# Patient Record
Sex: Female | Born: 1990 | Race: Black or African American | Hispanic: No | Marital: Single | Smoking: Former smoker
Health system: Southern US, Community
[De-identification: ages and names within clinical notes are randomized; demographics above are authoritative.]

## PROBLEM LIST (undated history)

## (undated) DIAGNOSIS — O139 Gestational [pregnancy-induced] hypertension without significant proteinuria, unspecified trimester: Secondary | ICD-10-CM

## (undated) DIAGNOSIS — T7840XA Allergy, unspecified, initial encounter: Secondary | ICD-10-CM

## (undated) HISTORY — DX: Gestational (pregnancy-induced) hypertension without significant proteinuria, unspecified trimester: O13.9

## (undated) HISTORY — DX: Allergy, unspecified, initial encounter: T78.40XA

---
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
## 2012-01-29 ENCOUNTER — Emergency Department (HOSPITAL_COMMUNITY)
Admission: EM | Admit: 2012-01-29 | Discharge: 2012-01-29 | Disposition: A | Discharge status: Home / Self Care | Payer: Managed Care, Other (non HMO) | Attending: Emergency Medicine | Admitting: Emergency Medicine

## 2012-01-29 ENCOUNTER — Encounter (HOSPITAL_COMMUNITY): Payer: Self-pay | Admitting: Emergency Medicine

## 2012-01-29 DIAGNOSIS — N390 Urinary tract infection, site not specified: Secondary | ICD-10-CM | POA: Insufficient documentation

## 2012-01-29 DIAGNOSIS — F17200 Nicotine dependence, unspecified, uncomplicated: Secondary | ICD-10-CM | POA: Insufficient documentation

## 2012-01-29 LAB — URINALYSIS, ROUTINE W REFLEX MICROSCOPIC
Glucose, UA: NEGATIVE mg/dL
Ketones, ur: 15 mg/dL — AB
Nitrite: NEGATIVE
Protein, ur: 100 mg/dL — AB
Urobilinogen, UA: 0.2 mg/dL (ref 0.0–1.0)

## 2012-01-29 LAB — POCT PREGNANCY, URINE: Preg Test, Ur: NEGATIVE

## 2012-01-29 LAB — URINE MICROSCOPIC-ADD ON

## 2012-01-29 MED ORDER — SULFAMETHOXAZOLE-TRIMETHOPRIM 800-160 MG PO TABS: 1.0000 | ORAL_TABLET | Freq: Two times a day (BID) | ORAL | Status: AC

## 2012-01-29 MED ORDER — ONDANSETRON 4 MG PO TBDP
8.0000 mg | ORAL_TABLET | Freq: Once | ORAL | Status: AC
  Administered 2012-01-29: 8 mg via ORAL
  Filled 2012-01-29: qty 2

## 2012-01-29 MED ORDER — SULFAMETHOXAZOLE-TMP DS 800-160 MG PO TABS
1.0000 | ORAL_TABLET | Freq: Once | ORAL | Status: AC
  Administered 2012-01-29: 1 via ORAL
  Filled 2012-01-29: qty 1

## 2012-01-29 NOTE — ED Notes (Signed)
Patient with right flank pain, sharp in nature, started 4 days ago.  Patient does have some nausea.  No vomiting.

## 2012-01-29 NOTE — ED Notes (Signed)
Pt reports burning when she urinates and noticing more frequency in having to use restroom.

## 2012-01-29 NOTE — ED Provider Notes (Signed)
History     CSN: [id number]  Arrival date & time 01/29/12  1846   First MD Initiated Contact with Patient 01/29/12 2203      Chief Complaint  Patient presents with  . Flank Pain    (Consider location/radiation/quality/duration/timing/severity/associated sxs/prior treatment) Patient is a 21 y.o. female presenting with flank pain. The history is provided by the patient.  Flank Pain This is a new problem. Episode onset: 4 days ago. The problem occurs constantly. The problem has been gradually worsening. Pertinent negatives include no abdominal pain and no shortness of breath. Associated symptoms comments: Nausea, no vomiting or fever. The symptoms are aggravated by nothing. The symptoms are relieved by nothing. She has tried nothing for the symptoms. The treatment provided no relief.    History reviewed. No pertinent past medical history.  History reviewed. No pertinent past surgical history.  History reviewed. No pertinent family history.  History  Substance Use Topics  . Smoking status: Current Everyday Smoker    Types: Cigarettes  . Smokeless tobacco: Not on file  . Alcohol Use: No    OB History    Grav Para Term Preterm Abortions TAB SAB Ect Mult Living                  Review of Systems  Constitutional: Negative for fever and chills.  Respiratory: Negative for shortness of breath.   Gastrointestinal: Negative for abdominal pain.  Genitourinary: Positive for dysuria, urgency, frequency and flank pain. Negative for vaginal bleeding and vaginal discharge.  All other systems reviewed and are negative.    Allergies  Review of patient's allergies indicates no known allergies.  Home Medications   Current Outpatient Rx  Name Route Sig Dispense Refill  . JUNEL FE 1/20 1-20 MG-MCG PO TABS  TAKE 1 TABLET BY MOUTH DAILY 1 each 0    Needs ov    BP 130/79  Pulse 91  Temp(Src) 98.3 F (36.8 C) (Oral)  Resp 18  SpO2 100%  Physical Exam  Nursing note and vitals  reviewed. Constitutional: She is oriented to person, place, and time. She appears well-developed and well-nourished. No distress.  HENT:  Head: Normocephalic and atraumatic.  Mouth/Throat: Oropharynx is clear and moist.  Eyes: Conjunctivae and EOM are normal. Pupils are equal, round, and reactive to light.  Neck: Normal range of motion. Neck supple.  Cardiovascular: Normal rate, regular rhythm and intact distal pulses.   No murmur heard. Pulmonary/Chest: Effort normal and breath sounds normal. No respiratory distress. She has no wheezes. She has no rales.  Abdominal: Soft. She exhibits no distension. There is no tenderness. There is CVA tenderness. There is no rebound and no guarding.       Right cva tenderness  Musculoskeletal: Normal range of motion. She exhibits no edema and no tenderness.  Neurological: She is alert and oriented to person, place, and time.  Skin: Skin is warm and dry. No rash noted. No erythema.  Psychiatric: She has a normal mood and affect. Her behavior is normal.    ED Course  Procedures (including critical care time)  Labs Reviewed  URINALYSIS, ROUTINE W REFLEX MICROSCOPIC - Abnormal; Notable for the following:    APPearance CLOUDY (*)    Hgb urine dipstick LARGE (*)    Ketones, ur 15 (*)    Protein, ur 100 (*)    Leukocytes, UA LARGE (*)    All other components within normal limits  URINE MICROSCOPIC-ADD ON - Abnormal; Notable for the following:  Squamous Epithelial / LPF MANY (*)    Bacteria, UA MANY (*)    All other components within normal limits  POCT PREGNANCY, URINE   No results found.   1. UTI (lower urinary tract infection)       MDM   Pt without sx of pyelo ( no vomiting or fever)  Denies vag complaints.  UPT neg.  UA with UTI.  Pt treated with bactrim        Gwyneth Sprout, MD 01/30/12 [phone]

## 2012-01-29 NOTE — Discharge Instructions (Signed)

## 2012-01-30 MED ORDER — FLUCONAZOLE 200 MG PO TABS: 200.0000 mg | ORAL_TABLET | Freq: Every day | ORAL | Status: AC

## 2012-02-22 ENCOUNTER — Telehealth: Payer: Self-pay

## 2012-02-22 NOTE — Telephone Encounter (Signed)
Is this something she will need to come in to clinic to reevaluate or can we just write the note?

## 2012-02-22 NOTE — Telephone Encounter (Signed)
Based on documentation by Dr. Hal Hope that sounds appropriate. Ok to write note.

## 2012-02-22 NOTE — Telephone Encounter (Signed)
PT STATES IN NEED OF HER MEDICAL RECORDS, SHE IS GOING INTO THE [employer] AND THEY ARE REQUESTING THEM  ALSO PT SHE NEED A NOTE STATING THAT EVEN THOUGH WE SAW A CYST ON HER CHEST, SHE DOESN'T NEED SURGERY AND THEY NO LONGER SEE THE CYST REALLY NEED THIS NOTE BY Monday AND CAN WAIT ON THE MEDICAL RECORDS IF SHE HAVE TO PLEASE CALL [phone] WHEN READY

## 2012-02-25 NOTE — Telephone Encounter (Signed)
Letter written/printed and in drawer for p/up. Pt notified on VM.

## 2012-07-08 ENCOUNTER — Telehealth: Payer: Self-pay

## 2012-07-08 NOTE — Telephone Encounter (Signed)
Patient over due for follow up. She is advised. She wants to come in next week on Thursday, she is advised to come in to the Urgent care then.

## 2012-07-08 NOTE — Telephone Encounter (Signed)
PT WOULD LIKE A REFILL ON HER BIRTH CONTROL PILLS AND WAS TOLD NOT TO CALL THE PHARMACY FIRST, BUT TO CALL us PLEASE CALL [phone]   [facility] ON [address]

## 2012-07-17 ENCOUNTER — Ambulatory Visit (INDEPENDENT_AMBULATORY_CARE_PROVIDER_SITE_OTHER): Payer: Managed Care, Other (non HMO) | Admitting: Family Medicine

## 2012-07-17 VITALS — BP 133/79 | HR 68 | Temp 98.2°F | Resp 16 | Ht 64.0 in | Wt 155.4 lb

## 2012-07-17 DIAGNOSIS — Z3041 Encounter for surveillance of contraceptive pills: Secondary | ICD-10-CM

## 2012-07-17 DIAGNOSIS — Z01419 Encounter for gynecological examination (general) (routine) without abnormal findings: Secondary | ICD-10-CM

## 2012-07-17 DIAGNOSIS — T192XXA Foreign body in vulva and vagina, initial encounter: Secondary | ICD-10-CM

## 2012-07-17 DIAGNOSIS — Z0000 Encounter for general adult medical examination without abnormal findings: Secondary | ICD-10-CM

## 2012-07-17 DIAGNOSIS — F17200 Nicotine dependence, unspecified, uncomplicated: Secondary | ICD-10-CM

## 2012-07-17 DIAGNOSIS — N72 Inflammatory disease of cervix uteri: Secondary | ICD-10-CM

## 2012-07-17 LAB — POCT CBC
Granulocyte percent: 64.2 %G (ref 37–80)
HCT, POC: 46.5 % (ref 37.7–47.9)
Hemoglobin: 14 g/dL (ref 12.2–16.2)
MCV: 98.8 fL — AB (ref 80–97)
RBC: 4.71 M/uL (ref 4.04–5.48)

## 2012-07-17 MED ORDER — NORETHIN ACE-ETH ESTRAD-FE 1-20 MG-MCG PO TABS: 1.0000 | ORAL_TABLET | Freq: Every day | ORAL | Status: DC

## 2012-07-17 MED ORDER — CIPROFLOXACIN HCL 500 MG PO TABS: 500.0000 mg | ORAL_TABLET | Freq: Two times a day (BID) | ORAL | Status: DC

## 2012-07-17 NOTE — Progress Notes (Signed)
History: Patient is here for physical examination. She desired a refill of her birth control pills. She is generally healthy.  Past medical history: Surgeries: None Major medical illnesses: None Hospitalizations: None Medication allergies: None Regular medications: Oral contraception  Social history: Start going to college but then quit that. She is in the process of applying to join the [employer]. She's always wanted to be in the [employer]. She is single. Has a boyfriend whom she has had for a couple of months.  Review of systems: Constitutional: Unremarkable HEENT: Unremarkable Respiratory: Unremarkable GI: Unremarkable GU: Vaginal odor. She's tried douching. Musculoskeletal: Unremarkable Neurologic: Unremarkable Dermatologic: Unremarkable Psychiatric: Unremarkable  Physical examination Well-developed well-nourished young lady in no acute distress. HEENT normal. Eyes PERRLA. Fundi benign. Throat clear. Neck supple without nodes thyromegaly. No current bruits. Chest clear to auscultation. Heart regular without murmurs gallops or arrhythmias. Breasts full without any masses. She has multiple piercings including her tongue nipples and umbilicus. Abdomen soft without mass or tenderness. Pelvic normal external genitalia. Odor was noted. Vaginal exam reveals a retained tampon. This was removed with ring forceps without anesthesia. She is quite tender. Digital exam was normal. The cervix was friable. Pap was taken. Also did a routine bacterial culture to extremities unremarkable. Skin unremarkable.  Assessment: Physical examination Contraception Cervicitis Retained tampon  Plan: Refill contraception pills. Instructed her to use condoms for the first month. Advised regular condom use to avoid STDs. She does have a history of having had Chlamydia once. Denied sexual activity since she ran out of the pills.  Cipro 500 twice a day for one week Return if worse.       Results for  orders placed in visit on 07/17/12  POCT CBC      Component Value Range   WBC 7.9  4.6 - 10.2 K/uL   Lymph, poc 2.4  0.6 - 3.4   POC LYMPH PERCENT 31.0  10 - 50 %L   MID (cbc) 0.4  0 - 0.9   POC MID % 4.8  0 - 12 %M   POC Granulocyte 5.1  2 - 6.9   Granulocyte percent 64.2  37 - 80 %G   RBC 4.71  4.04 - 5.48 M/uL   Hemoglobin 14.0  12.2 - 16.2 g/dL   HCT, POC 16.1  09.6 - 47.9 %   MCV 98.8 (*) 80 - 97 fL   MCH, POC 29.7  27 - 31.2 pg   MCHC 30.1 (*) 31.8 - 35.4 g/dL   RDW, POC 04.5     Platelet Count, POC 272  142 - 424 K/uL   MPV 9.3  0 - 99.8 fL

## 2012-07-17 NOTE — Patient Instructions (Signed)
Take antibiotics for infected cervix.  Return if worse vaginal symptoms.  Take pills faithfully, but use condoms also.

## 2012-07-18 LAB — LIPID PANEL
Cholesterol: 151 mg/dL (ref 0–200)
LDL Cholesterol: 64 mg/dL (ref 0–99)
VLDL: 21 mg/dL (ref 0–40)

## 2012-07-18 LAB — COMPREHENSIVE METABOLIC PANEL
ALT: 14 U/L (ref 0–35)
AST: 14 U/L (ref 0–37)
Albumin: 4.8 g/dL (ref 3.5–5.2)
BUN: 12 mg/dL (ref 6–23)
CO2: 27 mEq/L (ref 19–32)
Calcium: 9.8 mg/dL (ref 8.4–10.5)
Chloride: 103 mEq/L (ref 96–112)
Potassium: 3.8 mEq/L (ref 3.5–5.3)

## 2012-07-19 LAB — RPR

## 2012-07-19 LAB — HIV ANTIBODY (ROUTINE TESTING W REFLEX): HIV: NONREACTIVE

## 2012-07-21 LAB — WOUND CULTURE
Gram Stain: NONE SEEN
Organism ID, Bacteria: NORMAL

## 2012-07-22 LAB — PAP IG, CT-NG, RFX HPV ASCU: Chlamydia Probe Amp: POSITIVE — AB

## 2012-07-24 ENCOUNTER — Other Ambulatory Visit: Payer: Self-pay

## 2012-07-24 MED ORDER — DOXYCYCLINE HYCLATE 100 MG PO CAPS: 100.0000 mg | ORAL_CAPSULE | Freq: Two times a day (BID) | ORAL | Status: DC

## 2012-07-24 MED ORDER — FLUCONAZOLE 150 MG PO TABS: 150.0000 mg | ORAL_TABLET | Freq: Once | ORAL | Status: DC

## 2012-08-18 ENCOUNTER — Telehealth: Payer: Self-pay

## 2012-08-18 DIAGNOSIS — Z3041 Encounter for surveillance of contraceptive pills: Secondary | ICD-10-CM

## 2012-08-18 MED ORDER — NORETHIN ACE-ETH ESTRAD-FE 1-20 MG-MCG PO TABS: 1.0000 | ORAL_TABLET | Freq: Every day | ORAL | Status: DC

## 2012-08-18 NOTE — Telephone Encounter (Signed)
Resubmitted so she can get 36mo with refills/ amy

## 2012-08-18 NOTE — Telephone Encounter (Signed)
Pt states her insurance company will not pay for a thirty day supply for her birth control pills.  She needs 90 days   [facility] [address]   [phone]

## 2012-09-15 ENCOUNTER — Telehealth: Payer: Self-pay

## 2012-09-15 NOTE — Telephone Encounter (Signed)
Pt says she had to call in to get her 90 day supply of her birth control pills [phone]

## 2012-09-15 NOTE — Telephone Encounter (Signed)
This was sent in for her, 3 packs with 4 refills last month. She is advised, if pharmacy needs anything else to call me.

## 2012-09-16 ENCOUNTER — Other Ambulatory Visit: Payer: Self-pay | Admitting: Radiology

## 2012-09-16 DIAGNOSIS — Z3041 Encounter for surveillance of contraceptive pills: Secondary | ICD-10-CM

## 2012-09-16 MED ORDER — NORETHIN ACE-ETH ESTRAD-FE 1-20 MG-MCG PO TABS: 1.0000 | ORAL_TABLET | Freq: Every day | ORAL | Status: DC

## 2012-09-16 NOTE — Telephone Encounter (Signed)
Rx was sent in for patient to [facility] for her BCP, now the [facility] is out of business, the remaining supply is reordered through mail order pharmacy per her request.

## 2012-09-22 ENCOUNTER — Telehealth: Payer: Self-pay

## 2012-09-22 NOTE — Telephone Encounter (Signed)
Patient called wanting to speak to a female Docter or only Amy because she wanted to ask some personal questions about her "female problems". Patient wouldn't say exactly what or if it is an emergency. All she said was that she didn't want to pay an office visit if they (female clinical) can tell her what she has.

## 2012-09-23 NOTE — Telephone Encounter (Signed)
Patient c/o vaginal irritation comes and goes. States she can come in for this, and we can do a Wet prep. She wants to make appt. I have advised her this is the best way to deal with this, is to come in for wet prep and discussion.

## 2012-12-13 ENCOUNTER — Telehealth: Payer: Self-pay

## 2012-12-13 DIAGNOSIS — Z3041 Encounter for surveillance of contraceptive pills: Secondary | ICD-10-CM

## 2012-12-13 NOTE — Telephone Encounter (Signed)
Need refill on bcp. Please write for 90 day supply.  [facility] [address]  [phone]

## 2012-12-14 MED ORDER — NORETHIN ACE-ETH ESTRAD-FE 1-20 MG-MCG PO TABS: 1.0000 | ORAL_TABLET | Freq: Every day | ORAL | Status: DC

## 2012-12-14 NOTE — Telephone Encounter (Signed)
Spoke with pt. Advised Rx sent to her pharmacy.

## 2012-12-14 NOTE — Telephone Encounter (Signed)
done

## 2013-03-17 ENCOUNTER — Telehealth: Payer: Self-pay

## 2013-03-17 NOTE — Telephone Encounter (Signed)
Error

## 2013-10-08 ENCOUNTER — Ambulatory Visit (INDEPENDENT_AMBULATORY_CARE_PROVIDER_SITE_OTHER): Payer: Managed Care, Other (non HMO) | Admitting: Family Medicine

## 2013-10-08 VITALS — BP 122/70 | HR 89 | Temp 98.7°F | Resp 16 | Ht 63.75 in | Wt 172.0 lb

## 2013-10-08 DIAGNOSIS — N898 Other specified noninflammatory disorders of vagina: Secondary | ICD-10-CM

## 2013-10-08 DIAGNOSIS — L0291 Cutaneous abscess, unspecified: Secondary | ICD-10-CM

## 2013-10-08 DIAGNOSIS — Z23 Encounter for immunization: Secondary | ICD-10-CM

## 2013-10-08 DIAGNOSIS — Z3041 Encounter for surveillance of contraceptive pills: Secondary | ICD-10-CM

## 2013-10-08 DIAGNOSIS — L0390 Cellulitis, unspecified: Secondary | ICD-10-CM

## 2013-10-08 DIAGNOSIS — Z113 Encounter for screening for infections with a predominantly sexual mode of transmission: Secondary | ICD-10-CM

## 2013-10-08 DIAGNOSIS — N912 Amenorrhea, unspecified: Secondary | ICD-10-CM

## 2013-10-08 LAB — POCT UA - MICROSCOPIC ONLY
Casts, Ur, LPF, POC: NEGATIVE
Crystals, Ur, HPF, POC: NEGATIVE
Mucus, UA: NEGATIVE
RBC, urine, microscopic: NEGATIVE
Yeast, UA: NEGATIVE

## 2013-10-08 LAB — POCT URINALYSIS DIPSTICK
Bilirubin, UA: NEGATIVE
Blood, UA: NEGATIVE
Glucose, UA: NEGATIVE
Ketones, UA: NEGATIVE
Leukocytes, UA: NEGATIVE
Nitrite, UA: NEGATIVE
Protein, UA: NEGATIVE
Spec Grav, UA: 1.02
Urobilinogen, UA: 0.2
pH, UA: 8

## 2013-10-08 LAB — POCT WET PREP WITH KOH
KOH Prep POC: NEGATIVE
Trichomonas, UA: NEGATIVE
Yeast Wet Prep HPF POC: NEGATIVE

## 2013-10-08 LAB — POCT CBC
Granulocyte percent: 39.7 % (ref 37–80)
HCT, POC: 41.7 % (ref 37.7–47.9)
Hemoglobin: 13 g/dL (ref 12.2–16.2)
Lymph, poc: 2.2 (ref 0.6–3.4)
MCH, POC: 30.4 pg (ref 27–31.2)
MCHC: 31.2 g/dL — AB (ref 31.8–35.4)
MCV: 97.6 fL — AB (ref 80–97)
MID (cbc): 0.4 (ref 0–0.9)
MPV: 8.1 fL (ref 0–99.8)
POC Granulocyte: 1.7 — AB (ref 2–6.9)
POC LYMPH PERCENT: 51.1 %L — AB (ref 10–50)
POC MID %: 9.2 %M (ref 0–12)
Platelet Count, POC: 217 10*3/uL (ref 142–424)
RBC: 4.27 M/uL (ref 4.04–5.48)
RDW, POC: 13.2 %
WBC: 4.3 10*3/uL — AB (ref 4.6–10.2)

## 2013-10-08 LAB — POCT URINE PREGNANCY: Preg Test, Ur: NEGATIVE

## 2013-10-08 LAB — POCT GLYCOSYLATED HEMOGLOBIN (HGB A1C): Hemoglobin A1C: 5.4

## 2013-10-08 MED ORDER — NORETHIN ACE-ETH ESTRAD-FE 1-20 MG-MCG PO TABS: 1.0000 | ORAL_TABLET | Freq: Every day | ORAL | Status: DC

## 2013-10-08 MED ORDER — DOXYCYCLINE HYCLATE 100 MG PO CAPS: 100.0000 mg | ORAL_CAPSULE | Freq: Two times a day (BID) | ORAL | Status: DC

## 2013-10-08 MED ORDER — FLUCONAZOLE 150 MG PO TABS: 150.0000 mg | ORAL_TABLET | Freq: Once | ORAL | Status: DC

## 2013-10-08 NOTE — Progress Notes (Signed)
Chief Complaint:  Chief Complaint  Patient presents with  . Recurrent Skin Infections    Boils vaginal and thigh  . Flu Vaccine  . Contraception    Stopped taking birth control     HPI: Danielle Baldwin is a 23 y.o. female who is here for : 1. Boils-has vaginal boils and that has gone down but she has 1 onher thigh that still is present but better. Recurrent boils regular. She is not diabetic. Last episode of boils was 2 weeks ago. She gets them occ in her armpits, she states he may have had it x 2. No fevers, chills 2. Desires flu vaccine, deneis fevers, chills, had flu vaccine in the past 3. Would like OCP  She has been on many in the past., she was on OCP and then  3 years after that she was on Depot ( she was getting weight gain) , OCP Jenelle Fe was stopped in JUne 2015. Wants to know if she has other options. She is a smoker.  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 Allergy    History reviewed. No pertinent past surgical history. History   Social History  . Marital Status: Single    Spouse Name: N/A    Number of Children: N/A  . Years of Education: N/A   Social History Main Topics  . Smoking status: Current Every Day Smoker    Types: Cigarettes  . Smokeless tobacco: None  . Alcohol Use: No  . Drug Use: No  . Sexual Activity: Yes    Birth Control/ Protection: Pill   Other Topics Concern  . None   Social History Narrative  . None   Family History  Problem Relation Age of Onset  . Hypertension Mother   . Hypertension Maternal Grandmother   . Cancer Maternal Grandfather    No Known Allergies Prior to Admission medications   Medication Sig Start Date End Date Taking? Authorizing Provider  ciprofloxacin (CIPRO) 500 MG tablet Take 1 tablet (500 mg total) by mouth 2 (two) times daily.  07/17/12   Peyton Najjar, MD  doxycycline (VIBRAMYCIN) 100 MG capsule Take 1 capsule (100 mg total) by mouth 2 (two) times daily. 07/24/12   Peyton Najjar, MD  fluconazole (DIFLUCAN) 150 MG tablet Take 1 tablet (150 mg total) by mouth once. May repeat if needed after completion of Antibiotic. 07/24/12   Peyton Najjar, MD  norethindrone-ethinyl estradiol (JUNEL FE 1/20) 1-20 MG-MCG tablet Take 1 tablet by mouth daily. 12/14/12   Anders Simmonds, PA-C     ROS: The patient denies fevers, chills, night sweats, unintentional weight loss, chest pain, palpitations, wheezing, dyspnea on exertion, nausea, vomiting, abdominal pain, dysuria, hematuria, melena, numbness, weakness, or tingling.   All other systems have been reviewed and were otherwise negative with the exception of those mentioned in the HPI and as above.    PHYSICAL EXAM: Filed Vitals:   10/08/13 1641  BP: 122/70  Pulse: 89  Temp: 98.7 F (37.1 C)  Resp: 16   Filed Vitals:   10/08/13 1641  Height: 5' 3.75" (1.619 m)  Weight: 172 lb (78.019 kg)   Body mass index is 29.77 kg/(m^2).  General: Alert, no acute distress HEENT:  Normocephalic, atraumatic, oropharynx patent.  EOMI, PERRLA Cardiovascular:  Regular rate and rhythm, no rubs murmurs or gallops.  No Carotid bruits, radial pulse intact. No pedal edema.  Respiratory: Clear to auscultation bilaterally.  No wheezes, rales, or rhonchi.  No cyanosis, no use of accessory musculature GI: No organomegaly, abdomen is soft and non-tender, positive bowel sounds.  No masses. Skin: + abscess 3/4 iinch in diameter on right inner groin, + inguinal LAD, no axillary LAD or masses Neurologic: Facial musculature symmetric. Psychiatric: Patient is appropriate throughout our interaction. Lymphatic: No cervical lymphadenopathy Musculoskeletal: Gait intact. GU-normal, minimal dc but looks physiological, no cmt  LABS: Results for orders placed in visit on 10/08/13  POCT CBC      Result  Value Range   WBC 4.3 (*) 4.6 - 10.2 K/uL   Lymph, poc 2.2  0.6 - 3.4   POC LYMPH PERCENT 51.1 (*) 10 - 50 %L   MID (cbc) 0.4  0 - 0.9   POC MID % 9.2  0 - 12 %M   POC Granulocyte 1.7 (*) 2 - 6.9   Granulocyte percent 39.7  37 - 80 %G   RBC 4.27  4.04 - 5.48 M/uL   Hemoglobin 13.0  12.2 - 16.2 g/dL   HCT, POC 16.1  09.6 - 47.9 %   MCV 97.6 (*) 80 - 97 fL   MCH, POC 30.4  27 - 31.2 pg   MCHC 31.2 (*) 31.8 - 35.4 g/dL   RDW, POC 04.5     Platelet Count, POC 217  142 - 424 K/uL   MPV 8.1  0 - 99.8 fL  POCT URINE PREGNANCY      Result Value Range   Preg Test, Ur Negative    POCT GLYCOSYLATED HEMOGLOBIN (HGB A1C)      Result Value Range   Hemoglobin A1C 5.4    POCT URINALYSIS DIPSTICK      Result Value Range   Color, UA yellow     Clarity, UA clear     Glucose, UA neg     Bilirubin, UA neg     Ketones, UA neg     Spec Grav, UA 1.020     Blood, UA neg     pH, UA 8.0     Protein, UA neg     Urobilinogen, UA 0.2     Nitrite, UA neg     Leukocytes, UA Negative    POCT UA - MICROSCOPIC ONLY      Result Value Range   WBC, Ur, HPF, POC 0-2     RBC, urine, microscopic neg     Bacteria, U Microscopic trace     Mucus, UA neg     Epithelial cells, urine per micros 1-3     Crystals, Ur, HPF, POC neg     Casts, Ur, LPF, POC neg     Yeast, UA neg    POCT WET PREP WITH KOH      Result Value Range   Trichomonas, UA Negative     Clue Cells Wet Prep HPF POC 0-1     Epithelial Wet Prep HPF POC 3-6     Yeast Wet Prep HPF POC neg     Bacteria Wet Prep HPF POC 1+     RBC Wet Prep HPF POC 0-1     WBC Wet Prep HPF POC 2-4     KOH Prep POC Negative       EKG/XRAY:   Primary read interpreted by Dr. Conley Rolls at [facility].  ASSESSMENT/PLAN: Encounter Diagnoses  Name Primary?  . Screening for STD (sexually transmitted disease) Yes  . Vaginal discharge   . Flu vaccine need   . Amenorrhea   . Skin abscess    Rx Doxycycline, Diflucan, OCP. DOxt will also treat for GC while we are waiting  for STD labs Flu vaccine given Advise to practice safe sex Wound cx pending F/u in 2 days for wound check  Gross sideeffects, risk and benefits, and alternatives of medications d/w patient. Patient is aware that all medications have potential sideeffects and we are unable to predict every sideeffect or drug-drug interaction that may occur.  LE, THAO PHUONG, DO 10/08/2013 6:30 PM

## 2013-10-08 NOTE — Patient Instructions (Addendum)
Influenza Vaccine (Flu Vaccine, Inactivated or Recombinant) 2014 2015  What You Need to Know  Why get vaccinated?  Influenza ("flu") is a contagious disease that spreads around the [place] every winter, usually between October and May.  Flu is caused by influenza viruses, and is spread mainly by coughing, sneezing, and close contact.  Anyone can get flu, but the risk of getting flu is highest among children. Symptoms come on suddenly and may last several days. They can include:  · fever/chills  · sore throat  · muscle aches  · fatigue.  · cough  · headache  · runny or stuffy nose  Flu can make some people much sicker than others. These people include young children, people 65 and older, pregnant women, and people with certain health conditions such as heart, lung or kidney disease, nervous system disorders, or a weakened immune system. Flu vaccination is especially important for these people, and anyone in close contact with them.  Flu can also lead to pneumonia, and make existing medical conditions worse. It can cause diarrhea and seizures in children.  Each year thousands of people in the [place] die from flu, and many more are hospitalized.  Flu vaccine is the best protection against flu and its complications. Flu vaccine also helps prevent spreading flu from person to person.  Inactivated and recombinant flu vaccines  You are getting an injectable flu vaccine, which is either an "inactivated" or "recombinant" vaccine. These vaccines do not contain any live influenza virus. They are given by injection with a needle, and often called the "flu shot."   A different live, attenuated (weakened) influenza vaccine is sprayed into the nostrils. This vaccine is described in a separate Vaccine Information Statement.  Flu vaccination is recommended every year. Some children 6 months through 8 years of age might need two doses during one year.  Flu viruses are always changing. Each year's flu vaccine is made to  protect against 3 or 4 viruses that are likely to cause disease that year. Flu vaccine cannot prevent all cases of flu, but it is the best defense against the disease.   It takes about 2 weeks for protection to develop after the vaccination, and protection lasts several months to a year.  Some illnesses that are not caused by influenza virus are often mistaken for flu. Flu vaccine will not prevent these illnesses. It can only prevent influenza.  Some inactivated flu vaccine contains a very small amount of a mercury-based preservative called thimerosal. Studies have shown that thimerosal in vaccines is not harmful, but flu vaccines that do not contain a preservative are available.  Some people should not get this vaccine  Tell the person who gives you the vaccine:  · If you have any severe, life-threatening allergies. If you ever had a life-threatening allergic reaction after a dose of flu vaccine, or have a severe allergy to any part of this vaccine, including (for example) an allergy to gelatin, antibiotics, or eggs, you may be advised not to get vaccinated. Most, but not all, types of flu vaccine contain a small amount of egg protein.  · If you ever had Guillain-Barré Syndrome (a severe paralyzing illness, also called GBS). Some people with a history of GBS should not get this vaccine. This should be discussed with your doctor.  · If you are not feeling well. It is usually okay to get flu vaccine when you have a mild illness, but you might be advised to wait until you   are usually mild and go away on their own. Problems that could happen after any vaccine:  Brief fainting spells can happen after any medical procedure, including vaccination. Sitting or lying down for about 15 minutes can help prevent fainting, and injuries caused by a fall. Tell your  doctor if you feel dizzy, or have vision changes or ringing in the ears.  Severe shoulder pain and reduced range of motion in the arm where a shot was given can happen, very rarely, after a vaccination.  Severe allergic reactions from a vaccine are very rare, estimated at less than 1 in a million doses. If one were to occur, it would usually be within a few minutes to a few hours after the vaccination. Mild problems following inactivated flu vaccine:  soreness, redness, or swelling where the shot was given  hoarseness  sore, red or itchy eyes  cough  fever  aches  headache  itching  fatigue If these problems occur, they usually begin soon after the shot and last 1 or 2 days. Moderate problems following inactivated flu vaccine:  Young children who get inactivated flu vaccine and pneumococcal vaccine (PCV13) at the same time may be at increased risk for seizures caused by fever. Ask your doctor for more information. Tell your doctor if a child who is getting flu vaccine has ever had a seizure. Inactivated flu vaccine does not contain live flu virus, so you cannot get the flu from this vaccine. As with any medicine, there is a very remote chance of a vaccine causing a serious injury or death. The safety of vaccines is always being monitored. For more information, visit: http://floyd.org/ What if there is a serious reaction? What should I look for?  Look for anything that concerns you, such as signs of a severe allergic reaction, very high fever, or behavior changes. Signs of a severe allergic reaction can include hives, swelling of the face and throat, difficulty breathing, a fast heartbeat, dizziness, and weakness. These would start a few minutes to a few hours after the vaccination. What should I do?  If you think it is a severe allergic reaction or other emergency that can't wait, call 9 1 1  and get the person to the nearest hospital. Otherwise, call your  doctor.  Afterward, the reaction should be reported to the Vaccine Adverse Event Reporting System (VAERS). Your doctor should file this report, or you can do it yourself through the VAERS website at www.vaers.LAgents.no, or by calling 1-5136978501. VAERS does not give medical advice. The National Vaccine Injury Compensation Program The National Vaccine Injury Compensation Program (VICP) is a federal program that was created to compensate people who may have been injured by certain vaccines. Persons who believe they may have been injured by a vaccine can learn about the program and about filing a claim by calling 1-651 019 6529 or visiting the VICP website at SpiritualWord.at. There is a time limit to file a claim for compensation. How can I learn more?  Ask your health care provider.  Call your local or state health department.  Contact the Centers for Disease Control and Prevention (CDC):  Call 303-335-9372 (1-800-CDC-INFO) or  Visit CDC's website at BiotechRoom.com.cy CDC Vaccine Information Statement (Interim) Inactivated Influenza Vaccine (04/21/2013) Document Released: 06/14/2006 Document Revised: 06/10/2013 Document Reviewed: 04/21/2013 [facility] Patient Information 2014 [place], [place].   Hidradenitis Suppurativa, Sweat Gland Abscess Hidradenitis suppurativa is a long lasting (chronic), uncommon disease of the sweat glands. With this, boil-like lumps and scarring develop in the groin, some  times under the arms (axillae), and under the breasts. It may also uncommonly occur behind the ears, in the crease of the buttocks, and around the genitals.  CAUSES  The cause is from a blocking of the sweat glands. They then become infected. It may cause drainage and odor. It is not contagious. So it cannot be given to someone else. It most often shows up in puberty (about 8610 to 21 years of age). But it may happen much later. It is similar to acne which is a disease of the sweat  glands. This condition is slightly more common in African-Americans and women. SYMPTOMS   Hidradenitis usually starts as one or more red, tender, swellings in the groin or under the arms (axilla).  Over a period of hours to days the lesions get larger. They often open to the skin surface, draining clear to yellow-colored fluid.  The infected area heals with scarring. DIAGNOSIS  Your caregiver makes this diagnosis by looking at you. Sometimes cultures (growing germs on plates in the lab) may be taken. This is to see what germ (bacterium) is causing the infection.  TREATMENT   Topical germ killing medicine applied to the skin (antibiotics) are the treatment of choice. Antibiotics taken by mouth (systemic) are sometimes needed when the condition is getting worse or is severe.  Avoid tight-fitting clothing which traps moisture in.  Dirt does not cause hidradenitis and it is not caused by poor hygiene.  Involved areas should be cleaned daily using an antibacterial soap. Some patients find that the liquid form of Lever 2000, applied to the involved areas as a lotion after bathing, can help reduce the odor related to this condition.  Sometimes surgery is needed to drain infected areas or remove scarred tissue. Removal of large amounts of tissue is used only in severe cases.  Birth control pills may be helpful.  Oral retinoids (vitamin A derivatives) for 6 to 12 months which are effective for acne may also help this condition.  Weight loss will improve but not cure hidradenitis. It is made worse by being overweight. But the condition is not caused by being overweight.  This condition is more common in people who have had acne.  It may become worse under stress. There is no medical cure for hidradenitis. It can be controlled, but not cured. The condition usually continues for years with periods of getting worse and getting better (remission). Document Released: 04/03/2004 Document Revised:  11/12/2011 Document Reviewed: 04/19/2008 [facility] Patient Information 2014 [place].

## 2013-10-08 NOTE — Progress Notes (Signed)
Procedure:  Consent obtained - Local anesthesia with 1% lido with epi - #11 blade used to make 1 cm incision into abscess - purulent discharge expressed and wound culture collected - packing placed into the wound and drsg placed - wound care d/w pt.

## 2013-10-09 LAB — RPR

## 2013-10-09 LAB — HIV ANTIBODY (ROUTINE TESTING W REFLEX): HIV: NONREACTIVE

## 2013-10-10 ENCOUNTER — Ambulatory Visit (INDEPENDENT_AMBULATORY_CARE_PROVIDER_SITE_OTHER): Payer: Managed Care, Other (non HMO) | Admitting: Physician Assistant

## 2013-10-10 VITALS — BP 146/72 | HR 92 | Temp 98.2°F | Resp 16 | Ht 63.0 in | Wt 174.8 lb

## 2013-10-10 DIAGNOSIS — L732 Hidradenitis suppurativa: Secondary | ICD-10-CM

## 2013-10-10 DIAGNOSIS — L02219 Cutaneous abscess of trunk, unspecified: Secondary | ICD-10-CM

## 2013-10-10 DIAGNOSIS — L02214 Cutaneous abscess of groin: Secondary | ICD-10-CM

## 2013-10-10 DIAGNOSIS — L03319 Cellulitis of trunk, unspecified: Secondary | ICD-10-CM

## 2013-10-10 NOTE — Progress Notes (Signed)
   Subjective:    Patient ID: Venancio PoissonMyeka Andre, female    DOB: 10/22/1990, 22 y.o.   MRN: [id number]  HPI   Ms. Shea EvansDunn is a pleasant 23 yr old female here for follow up on a right groin abscess that was drained 48 hours ago.  She reports she is improving but still tender.  Changed the dressing once yesterday.  Taking the doxy as directed.    Review of Systems  Constitutional: Negative for fever and chills.  Respiratory: Negative.   Cardiovascular: Negative.   Gastrointestinal: Negative.   Skin: Positive for wound.       Objective:   Physical Exam  Vitals reviewed. Constitutional: She is oriented to person, place, and time. She appears well-developed and well-nourished. No distress.  HENT:  Head: Normocephalic and atraumatic.  Eyes: Conjunctivae are normal. No scleral icterus.  Pulmonary/Chest: Effort normal.  Neurological: She is alert and oriented to person, place, and time.  Skin: Skin is warm and dry.     Healing abscess right groin; multiple indurated nodules adjacent to the incision; appears c/w hidradenitis    Cx: NGTD  Wound Care: Dressing and packing removed.  Small amount of drainage on bandage.  Unable to express any further drainage from the wound.  Wound bed healthy.  Not repacked.  Dressing applied.  Discussed wound care.        Assessment & Plan:  Hidradenitis - Plan: Ambulatory referral to Dermatology  Abscess of groin, right   Ms. Shea EvansDunn is a 23 yr old female here for wound care.  I have not repacked the area today.  Continue daily dressing changes until completely healed.  Mom and pt request ref to derm, which I have placed.  We also discussed the possibility of a surgery referral to discuss more definitive treatment.  Pt and mom express understanding.  Continue abx for now until cx final - if no growth and gc/chlamydia negative can likely d/c doxy.  Loleta DickerE. Elizabeth Cristina Ceniceros MHS, PA-C [facility] 2/7/20152:24 PM

## 2013-10-11 LAB — WOUND CULTURE
Gram Stain: NONE SEEN
Gram Stain: NONE SEEN
Gram Stain: NONE SEEN
Organism ID, Bacteria: NO GROWTH

## 2013-10-12 LAB — GC/CHLAMYDIA PROBE AMP
CT Probe RNA: NEGATIVE
GC Probe RNA: NEGATIVE

## 2013-10-14 ENCOUNTER — Telehealth: Payer: Self-pay

## 2013-10-14 NOTE — Telephone Encounter (Signed)
PT STATES SHE IS ON ANTIBIOTICS AND WOULD LIKE TO KNOW IF SHE CAN DRINK A LITTLE ALCOHOL, ALSO HAVE A QUESTION REGARDING HER VISIT PLEASE CALL [phone]

## 2013-10-16 NOTE — Telephone Encounter (Signed)
Pt advised not to drink. She had some questions about a "wart" on her boyfriend. Advised pt to have boyfriend RTC for evaluation. Pt agrees.

## 2013-11-27 ENCOUNTER — Telehealth: Payer: Self-pay

## 2013-11-27 ENCOUNTER — Ambulatory Visit (INDEPENDENT_AMBULATORY_CARE_PROVIDER_SITE_OTHER): Payer: Managed Care, Other (non HMO) | Admitting: Internal Medicine

## 2013-11-27 VITALS — BP 118/90 | HR 85 | Temp 98.6°F | Resp 16 | Ht 64.0 in | Wt 176.0 lb

## 2013-11-27 DIAGNOSIS — R635 Abnormal weight gain: Secondary | ICD-10-CM

## 2013-11-27 DIAGNOSIS — L659 Nonscarring hair loss, unspecified: Secondary | ICD-10-CM

## 2013-11-27 LAB — COMPLETE METABOLIC PANEL WITH GFR
ALT: 15 U/L (ref 0–35)
AST: 13 U/L (ref 0–37)
Albumin: 4.5 g/dL (ref 3.5–5.2)
Alkaline Phosphatase: 68 U/L (ref 39–117)
BILIRUBIN TOTAL: 0.3 mg/dL (ref 0.2–1.2)
BUN: 8 mg/dL (ref 6–23)
CALCIUM: 9.2 mg/dL (ref 8.4–10.5)
CHLORIDE: 104 meq/L (ref 96–112)
CO2: 26 meq/L (ref 19–32)
CREATININE: 0.7 mg/dL (ref 0.50–1.10)
GLUCOSE: 88 mg/dL (ref 70–99)
Potassium: 3.8 mEq/L (ref 3.5–5.3)
Sodium: 136 mEq/L (ref 135–145)
Total Protein: 7.3 g/dL (ref 6.0–8.3)

## 2013-11-27 LAB — POCT CBC
Granulocyte percent: 45.6 %G (ref 37–80)
HEMATOCRIT: 40.6 % (ref 37.7–47.9)
HEMOGLOBIN: 12.9 g/dL (ref 12.2–16.2)
Lymph, poc: 1.9 (ref 0.6–3.4)
MCH: 29.9 pg (ref 27–31.2)
MCHC: 31.8 g/dL (ref 31.8–35.4)
MCV: 94.3 fL (ref 80–97)
MID (cbc): 0.3 (ref 0–0.9)
MPV: 8.6 fL (ref 0–99.8)
POC Granulocyte: 1.9 — AB (ref 2–6.9)
POC LYMPH PERCENT: 46.9 %L (ref 10–50)
POC MID %: 7.5 % (ref 0–12)
Platelet Count, POC: 236 10*3/uL (ref 142–424)
RBC: 4.31 M/uL (ref 4.04–5.48)
RDW, POC: 13 %
WBC: 4.1 10*3/uL — AB (ref 4.6–10.2)

## 2013-11-27 NOTE — Progress Notes (Signed)
Subjective:    Patient ID: Danielle Baldwin, female    DOB: 08-21-1991, 22 y.o.   MRN: [id number]  HPI This chart was scribed for [facility], by Ladona Ridgel Day, Scribe. This patient was seen in room 13 and the patient's care was started at 4:42 PM.  HPI Comments: Danielle Baldwin is a 23 y.o. female who presents to the [facility] complaining of alopeica. She reports this problem has been ongoing for the past several months and seems to be worsening. She reports that he hair is falling out and having patches where she has no hair. She states that she is frequently itching her self but that this is normal for her. She offers pictures from her cell phone Which document her hair loss. She has tried several different scale moisturizers and dandruff shampoos without help She was previously on [facility] and stopped taking it about x1 year ago.  She does report weight gain over the past several months. Her skin is often dry. She reports heavier than normal menstrual periods over the past x6 months.  She states always feeling tired, difficulty falling asleep at night and that she has been stressed lately; although she is unsure why she is stressed.  She denies any other medical problems w/in the past year.  Past Medical History  Diagnosis Date  . Allergy     No Known Allergies    Review of Systems  Constitutional: Positive for fatigue and unexpected weight change. Negative for fever and chills.  HENT: Negative for dental problem and trouble swallowing.   Eyes: Negative for visual disturbance.  Respiratory: Negative for cough and shortness of breath.   Cardiovascular: Negative for chest pain, palpitations and leg swelling.  Gastrointestinal: Negative for nausea, abdominal pain and diarrhea.  Genitourinary: Negative for difficulty urinating.  Musculoskeletal: Negative for back pain.  Skin: Negative for rash.  Neurological: Negative for weakness and headaches.      Objective:   Physical Exam  Nursing note and vitals reviewed. Constitutional: She is oriented to person, place, and time. She appears well-developed and well-nourished. No distress.  HENT:  Head: Normocephalic and atraumatic.  Mouth/Throat: Oropharynx is clear and moist.  No signs of ringworm on her scalp//there are no areas of hair loss that are circumscribed/there is a generalized loss with several areas of shorter hairs that have broken  Eyes: Conjunctivae are normal. Pupils are equal, round, and reactive to light. Right eye exhibits no discharge. Left eye exhibits no discharge.  Neck: Normal range of motion. Neck supple. No thyromegaly present.  Cardiovascular: Normal rate, regular rhythm and normal heart sounds.   No murmur heard. Pulmonary/Chest: Effort normal and breath sounds normal. No respiratory distress.  Abdominal: Soft. Bowel sounds are normal. She exhibits no mass. There is no tenderness.  No hepatosplenomegaly  Musculoskeletal: Normal range of motion. She exhibits no edema.  Lymphadenopathy:    She has no cervical adenopathy.  Neurological: She is alert and oriented to person, place, and time.  Skin: Skin is warm and dry.  There are areas of hyperpigmentation on her abdomen that are horizontal and in 2 areas and on questioning she thinks those started in the last few days after using an exercise machine/not truly purpura or ecchymoses that are starting to hyperpigment at this point  Psychiatric: She has a normal mood and affect. Thought content normal.   Triage Vitals: BP 118/90  Pulse 85  Temp(Src) 98.6 F (37 C)  Resp 16  Ht 5\' 4"  (  1.626 m)  Wt 176 lb (79.833 kg)  BMI 30.20 kg/m2  SpO2 99%     Assessment & Plan:  DIAGNOSTIC STUDIES: Oxygen Saturation is 99% on room air, normal by my interpretation.    COORDINATION OF CARE: At 440 PM Discussed treatment plan with patient which includes blood work. Patient agrees.   I personally performed the services described in this  documentation, which was scribed in my presence. The recorded information has been reviewed and is accurate. I have completed the patient encounter in its entirety as documented by the scribe, with editing by me where necessary. Salmaan Patchin P. Merla Richesoolittle, M.D.  Hair loss - Plan: T4, free, TSH, COMPLETE METABOLIC PANEL WITH GFR, Cortisol, POCT CBC  Weight gain - Plan: T4, free, TSH, COMPLETE METABOLIC PANEL WITH GFR, Cortisol, POCT CBC  Skin rash abdomen? Secondary to trauma   Call labs and plan  Addendum 11/28/2013 labs= Results for orders placed in visit on 11/27/13  T4, FREE      Result Value Ref Range   Free T4 1.22  0.80 - 1.80 ng/dL  TSH      Result Value Ref Range   TSH 1.693  0.350 - 4.500 uIU/mL  COMPLETE METABOLIC PANEL WITH GFR      Result Value Ref Range   Sodium 136  135 - 145 mEq/L   Potassium 3.8  3.5 - 5.3 mEq/L   Chloride 104  96 - 112 mEq/L   CO2 26  19 - 32 mEq/L   Glucose, Bld 88  70 - 99 mg/dL   BUN 8  6 - 23 mg/dL   Creat 0.450.70  4.090.50 - 8.111.10 mg/dL   Total Bilirubin 0.3  0.2 - 1.2 mg/dL   Alkaline Phosphatase 68  39 - 117 U/L   AST 13  0 - 37 U/L   ALT 15  0 - 35 U/L   Total Protein 7.3  6.0 - 8.3 g/dL   Albumin 4.5  3.5 - 5.2 g/dL   Calcium 9.2  8.4 - 91.410.5 mg/dL   GFR, Est African American >89     GFR, Est Non African American >89    CORTISOL      Result Value Ref Range   Cortisol, Plasma 6.7    POCT CBC      Result Value Ref Range   WBC 4.1 (*) 4.6 - 10.2 K/uL   Lymph, poc 1.9  0.6 - 3.4   POC LYMPH PERCENT 46.9  10 - 50 %L   MID (cbc) 0.3  0 - 0.9   POC MID % 7.5  0 - 12 %M   POC Granulocyte 1.9 (*) 2 - 6.9   Granulocyte percent 45.6  37 - 80 %G   RBC 4.31  4.04 - 5.48 M/uL   Hemoglobin 12.9  12.2 - 16.2 g/dL   HCT, POC 78.240.6  95.637.7 - 47.9 %   MCV 94.3  80 - 97 fL   MCH, POC 29.9  27 - 31.2 pg   MCHC 31.8  31.8 - 35.4 g/dL   RDW, POC 21.313.0     Platelet Count, POC 236  142 - 424 K/uL   MPV 8.6  0 - 99.8 fL

## 2013-11-27 NOTE — Telephone Encounter (Signed)
Patient called stated her hair is coming out all over, Patient want to know if she need to see a medical doctor or dermatologist.

## 2013-11-28 LAB — TSH: TSH: 1.693 u[IU]/mL (ref 0.350–4.500)

## 2013-11-28 LAB — CORTISOL: CORTISOL PLASMA: 6.7 ug/dL

## 2013-11-28 LAB — T4, FREE: Free T4: 1.22 ng/dL (ref 0.80–1.80)

## 2013-11-28 NOTE — Telephone Encounter (Signed)
Pt came in 03/27

## 2013-12-01 ENCOUNTER — Telehealth: Payer: Self-pay

## 2014-01-13 ENCOUNTER — Telehealth: Payer: Self-pay

## 2014-01-13 NOTE — Telephone Encounter (Signed)
Needs her immunization records    Please call when ready for pick up   [phone]

## 2014-01-15 NOTE — Telephone Encounter (Signed)
Paper copy of records ready for pick up.   LMOV - bring ID with her.

## 2014-06-08 ENCOUNTER — Telehealth: Payer: Self-pay

## 2014-06-08 NOTE — Telephone Encounter (Signed)
Pt would like to come by and pick up a copy of her medical records, we had copied them before but she never picked them up. Please call pt at [phone] Was told it could take up to 72 hrs

## 2014-06-17 NOTE — Telephone Encounter (Signed)
Patient is requesting all medical records for [employer] purposes.  She will sign a release form when she picks these up.  Requested last week, has not heard from anyone. Printed medman and epic records  - ready in pick up drawer. Scan form when completed.

## 2016-02-12 ENCOUNTER — Ambulatory Visit (HOSPITAL_COMMUNITY)
Admission: EM | Admit: 2016-02-12 | Discharge: 2016-02-12 | Discharge status: Absent without leave | Payer: BLUE CROSS/BLUE SHIELD

## 2016-02-14 ENCOUNTER — Telehealth: Payer: Self-pay

## 2016-02-14 ENCOUNTER — Ambulatory Visit (INDEPENDENT_AMBULATORY_CARE_PROVIDER_SITE_OTHER): Payer: BLUE CROSS/BLUE SHIELD | Admitting: Emergency Medicine

## 2016-02-14 VITALS — BP 122/72 | HR 70 | Temp 98.5°F | Resp 17 | Ht 63.0 in | Wt 146.0 lb

## 2016-02-14 DIAGNOSIS — L02213 Cutaneous abscess of chest wall: Secondary | ICD-10-CM | POA: Diagnosis not present

## 2016-02-14 DIAGNOSIS — R21 Rash and other nonspecific skin eruption: Secondary | ICD-10-CM

## 2016-02-14 MED ORDER — CEPHALEXIN 500 MG PO CAPS: 500.0000 mg | ORAL_CAPSULE | Freq: Two times a day (BID) | ORAL | Status: DC

## 2016-02-14 NOTE — Patient Instructions (Addendum)
     IF you received an x-ray today, you will receive an invoice from [facility]. Please contact [facility] at [phone] with questions or concerns regarding your invoice.   IF you received labwork today, you will receive an invoice from [facility]. Please contact [facility] at [phone] with questions or concerns regarding your invoice.   Our billing staff will not be able to assist you with questions regarding bills from these companies.  You will be contacted with the lab results as soon as they are available. The fastest way to get your results is to activate your My Chart account. Instructions are located on the last page of this paperwork. If you have not heard from us regarding the results in 2 weeks, please contact this office.    Please take the antibiotic as prescribed. Please change the dressing daily.  Please change the dressing immediately if it gets wet.   Return in 2 days for wound care. Incision and Drainage, Care After Refer to this sheet in the next few weeks. These instructions provide you with information on caring for yourself after your procedure. Your caregiver may also give you more specific instructions. Your treatment has been planned according to current medical practices, but problems sometimes occur. Call your caregiver if you have any problems or questions after your procedure. HOME CARE INSTRUCTIONS   If antibiotic medicine is given, take it as directed. Finish it even if you start to feel better.  Only take over-the-counter or prescription medicines for pain, discomfort, or fever as directed by your caregiver.  Keep all follow-up appointments as directed by your caregiver.  Change any bandages (dressings) as directed by your caregiver. Replace old dressings with clean dressings.  Wash your hands before and after caring for your wound. You will receive specific instructions for cleansing and caring for your  wound.  SEEK MEDICAL CARE IF:   You have increased pain, swelling, or redness around the wound.  You have increased drainage, smell, or bleeding from the wound.  You have muscle aches, chills, or you feel generally sick.  You have a fever. MAKE SURE YOU:   Understand these instructions.  Will watch your condition.  Will get help right away if you are not doing well or get worse.   This information is not intended to replace advice given to you by your health care provider. Make sure you discuss any questions you have with your health care provider.   Document Released: 11/12/2011 Document Revised: 09/10/2014 Document Reviewed: 11/12/2011 Elsevier Interactive Patient Education [employer] Elsevier Inc.

## 2016-02-14 NOTE — Telephone Encounter (Signed)
Pt is needing to get more information about the diagnosis so she can google it   Best number [phone]

## 2016-02-14 NOTE — Progress Notes (Signed)
Procedure: Verbal consent obtained. Wound site cleansed with alcohol wipe. 1% lidocaine injected into the abscess located at low right side of anterior chest. This area was cleansed with povidone-iodine. 11 blade utilized to place a 1 cm incision. Purulent fluid expressed. Wound search for loculations without any. Necrotic tissue removed to reveal beefy red tissue. Procedure site cleansed with normal saline. Quarter packing aggressively placed into the wound.  Dressings applied.

## 2016-02-14 NOTE — Progress Notes (Signed)
By signing my name below, I, Stann Ore, attest that this documentation has been prepared under the direction and in the presence of Lesle Chris, MD. Electronically Signed: Stann Ore, Scribe. 02/14/2016 , 11:53 AM .  Patient was seen in room 3 .  Chief Complaint:  Chief Complaint  Patient presents with  . bump on chest  . bump on leg    HPI: Danielle Baldwin is a 25 y.o. female who reports to [facility] today complaining of a knot over her chest and another over her left leg noticed 4 days ago. Patient reports having a knot over her right upper chest with some drainage. She also notes having another bump in her inner left thigh without drainage; believes it's an ingrown hair. She also mentions having an abscess under her left arm with pus draining. She's been diagnosed hidradenitis in the past. She's been applying Whish ingrown hair serum over her left arm.   She's on nexplanon for birth control and also has flagyl for BV. She hasn't been taking her flagyl.   She plans to join the [employer].   Past Medical History  Diagnosis Date  . Allergy    No past surgical history on file. Social History   Social History  . Marital Status: Single    Spouse Name: N/A  . Number of Children: N/A  . Years of Education: N/A   Social History Main Topics  . Smoking status: Former Smoker    Types: Cigarettes    Quit date: 08/04/2015  . Smokeless tobacco: None  . Alcohol Use: No  . Drug Use: No  . Sexual Activity: Yes    Birth Control/ Protection: Pill   Other Topics Concern  . None   Social History Narrative   Family History  Problem Relation Age of Onset  . Hypertension Mother   . Hypertension Maternal Grandmother   . Cancer Maternal Grandfather    No Known Allergies Prior to Admission medications   Medication Sig Start Date End Date Taking? Authorizing Provider  Etonogestrel (NEXPLANON [place]) Inject into the skin.   Yes Historical Provider, MD     ROS:  Constitutional: negative  for fever, chills, night sweats, weight changes, or fatigue  HEENT: negative for vision changes, hearing loss, congestion, rhinorrhea, ST, epistaxis, or sinus pressure Cardiovascular: negative for chest pain or palpitations Respiratory: negative for hemoptysis, wheezing, shortness of breath, or cough Abdominal: negative for abdominal pain, nausea, vomiting, diarrhea, or constipation Dermatological: positive for rash Neurologic: negative for headache, dizziness, or syncope All other systems reviewed and are otherwise negative with the exception to those above and in the HPI.  PHYSICAL EXAM: Filed Vitals:   02/14/16 1126  BP: 122/72  Pulse: 70  Temp: 98.5 F (36.9 C)  Resp: 17   Body mass index is 25.87 kg/(m^2).   General: Alert, no acute distress HEENT:  Normocephalic, atraumatic, oropharynx patent. Eye: Nonie Hoyer [facility] Cardiovascular:  Regular rate and rhythm, no rubs murmurs or gallops.  No Carotid bruits, radial pulse intact. No pedal edema.  Respiratory: Clear to auscultation bilaterally.  No wheezes, rales, or rhonchi.  No cyanosis, no use of accessory musculature Abdominal: No organomegaly, abdomen is soft and non-tender, positive bowel sounds. No masses. Musculoskeletal: Gait intact. No edema, tenderness Skin: 1x2cm draining abscess above the right breast Neurologic: Facial musculature symmetric. Psychiatric: Patient acts appropriately throughout our interaction.  Lymphatic: No cervical or submandibular lymphadenopathy Genitourinary/Anorectal: she has evidence of hidradenitis suppurativa over the groin bilaterally  LABS:  EKG/XRAY:     ASSESSMENT/PLAN: Wound culture was sent. She will be on Keflex 500 twice a day. He declined a referral to dermatology because of financial reasons. Wound culture was sent.I personally performed the services described in this documentation, which was scribed in my presence. The recorded information has been reviewed and is  accurate.  Gross sideeffects, risk and benefits, and alternatives of medications d/w patient. Patient is aware that all medications have potential sideeffects and we are unable to predict every sideeffect or drug-drug interaction that may occur.  Lesle ChrisSteven Oren Barella MD 02/14/2016 11:38 AM

## 2016-02-15 NOTE — Telephone Encounter (Signed)
Spoke with pt, advised her of her diagnosis.

## 2016-02-16 ENCOUNTER — Ambulatory Visit (INDEPENDENT_AMBULATORY_CARE_PROVIDER_SITE_OTHER): Payer: BLUE CROSS/BLUE SHIELD | Admitting: Physician Assistant

## 2016-02-16 VITALS — BP 118/72 | HR 100 | Temp 98.8°F | Resp 15 | Ht 63.0 in | Wt 147.0 lb

## 2016-02-16 DIAGNOSIS — Z9889 Other specified postprocedural states: Secondary | ICD-10-CM

## 2016-02-16 DIAGNOSIS — L02213 Cutaneous abscess of chest wall: Secondary | ICD-10-CM

## 2016-02-16 NOTE — Patient Instructions (Signed)
     IF you received an x-ray today, you will receive an invoice from [facility]. Please contact [facility] at [phone] with questions or concerns regarding your invoice.   IF you received labwork today, you will receive an invoice from [facility]. Please contact [facility] at [phone] with questions or concerns regarding your invoice.   Our billing staff will not be able to assist you with questions regarding bills from these companies.  You will be contacted with the lab results as soon as they are available. The fastest way to get your results is to activate your My Chart account. Instructions are located on the last page of this paperwork. If you have not heard from us regarding the results in 2 weeks, please contact this office.      

## 2016-02-17 LAB — WOUND CULTURE
GRAM STAIN: NONE SEEN
GRAM STAIN: NONE SEEN

## 2016-02-17 NOTE — Progress Notes (Signed)
[facility] [address], [place] [address] [phone]  Date:  02/16/2016   Name:  Danielle Baldwin   DOB:  03/28/1991   MRN:  [id number]  PCP:  Tally DueGUEST, CHRIS WARREN, MD    History of Present Illness:  Danielle PoissonMyeka Baldwin is a 25 y.o. female patient who [facility].  Day 2 of abscess status post incision and drainage.  Healing well.  Changing dressing daily.  Very little discomfort, no drainage, redness, or fever.  Compliant on antibiotic.     There are no active problems to display for this patient.   Past Medical History  Diagnosis Date  . Allergy     No past surgical history on file.  Social History  Substance Use Topics  . Smoking status: Former Smoker    Types: Cigarettes    Quit date: 08/04/2015  . Smokeless tobacco: None  . Alcohol Use: No    Family History  Problem Relation Age of Onset  . Hypertension Mother   . Hypertension Maternal Grandmother   . Cancer Maternal Grandfather     No Known Allergies  Medication list has been reviewed and updated.  Current Outpatient Prescriptions on File Prior to Visit  Medication Sig Dispense Refill  . cephALEXin (KEFLEX) 500 MG capsule Take 1 capsule (500 mg total) by mouth 2 (two) times daily. 20 capsule 0  . Etonogestrel (NEXPLANON [place]) Inject into the skin.     No current facility-administered medications on file prior to visit.    ROS ROS otherwise urnemarakble unless listed above.  Physical Examination: BP 118/72 mmHg  Pulse 100  Temp(Src) 98.8 F (37.1 C) (Oral)  Resp 15  Ht 5\' 3"  (1.6 m)  Wt 147 lb (66.679 kg)  BMI 26.05 kg/m2  SpO2 97%  LMP 02/03/2016 Ideal Body Weight: Weight in (lb) to have BMI = 25: 140.8  Physical Exam  Constitutional: She is oriented to person, place, and time. She appears well-developed and well-nourished. No distress.  HENT:  Head: Normocephalic and atraumatic.  Right Ear: External ear normal.  Left Ear: External ear normal.  Eyes:  Conjunctivae and EOM are normal. Pupils are equal, round, and reactive to light.  Cardiovascular: Normal rate.   Pulmonary/Chest: Effort normal. No respiratory distress.  Neurological: She is alert and oriented to person, place, and time.  Skin: She is not diaphoretic.  Packing is gone.  Beefy red tissue without prulence.  Induration is very minimal.  Minimal tenderness upon palpation.  Wound less than cm depth.  Psychiatric: She has a normal mood and affect. Her behavior is normal.     Assessment and Plan: Danielle Baldwin is a 25 y.o. female who is here today for wound care status post i and d Healing well.  Advised cleansing and covering until new layer of skin.  Alarming sxs to warrant immediate return discussed.  Abscess of chest wall  Status post incision and drainage  Trena PlattStephanie English, PA-C [facility] 6/16/20174:49 AM

## 2016-11-19 ENCOUNTER — Ambulatory Visit (INDEPENDENT_AMBULATORY_CARE_PROVIDER_SITE_OTHER): Payer: BLUE CROSS/BLUE SHIELD | Admitting: Family Medicine

## 2016-11-19 VITALS — BP 122/62 | HR 79 | Temp 97.6°F | Resp 16 | Ht 63.0 in | Wt 154.0 lb

## 2016-11-19 DIAGNOSIS — L700 Acne vulgaris: Secondary | ICD-10-CM

## 2016-11-19 DIAGNOSIS — L819 Disorder of pigmentation, unspecified: Secondary | ICD-10-CM | POA: Diagnosis not present

## 2016-11-19 DIAGNOSIS — L739 Follicular disorder, unspecified: Secondary | ICD-10-CM | POA: Diagnosis not present

## 2016-11-19 DIAGNOSIS — L219 Seborrheic dermatitis, unspecified: Secondary | ICD-10-CM | POA: Insufficient documentation

## 2016-11-19 MED ORDER — CLINDAMYCIN PHOSPHATE 1 % EX GEL: Freq: Two times a day (BID) | CUTANEOUS | 1 refills | Status: DC

## 2016-11-19 MED ORDER — BENZOYL PEROXIDE 5 % EX LOTN: 1.0000 "application " | TOPICAL_LOTION | Freq: Every day | CUTANEOUS | 1 refills | Status: DC

## 2016-11-19 MED ORDER — HYDROCORTISONE 2.5 % EX CREA: TOPICAL_CREAM | Freq: Two times a day (BID) | CUTANEOUS | 0 refills | Status: DC

## 2016-11-19 MED ORDER — TRIAMCINOLONE ACETONIDE 0.5 % EX OINT: 1.0000 "application " | TOPICAL_OINTMENT | Freq: Two times a day (BID) | CUTANEOUS | 1 refills | Status: DC

## 2016-11-19 NOTE — Progress Notes (Signed)
Chief Complaint  Patient presents with  . Rash    all over x 1 month    HPI   Pt reports that she started having a rash that started on her face but reports that this is a chronic issue she struggles with and has recently taken down a weave and notices that her face is dry with pigment changes She reports that she has been using African Black Soap on her skin which makes it feel dry and tight and painful.   She reports ingrown hair all over her vagina  She reports that her face has been irritated with    Past Medical History:  Diagnosis Date  . Allergy     Current Outpatient Prescriptions  Medication Sig Dispense Refill  . Etonogestrel (NEXPLANON [place]) Inject into the skin.    . Benzoyl Peroxide 5 % lotion Apply 1 application topically at bedtime. 30 mL 1  . cephALEXin (KEFLEX) 500 MG capsule Take 1 capsule (500 mg total) by mouth 2 (two) times daily. (Patient not taking: Reported on 11/19/2016) 20 capsule 0  . clindamycin (CLINDAGEL) 1 % gel Apply topically 2 (two) times daily. 30 g 1  . hydrocortisone 2.5 % cream Apply topically 2 (two) times daily. 30 g 0  . triamcinolone ointment (KENALOG) 0.5 % Apply 1 application topically 2 (two) times daily. 30 g 1   No current facility-administered medications for this visit.     Allergies: No Known Allergies  No past surgical history on file.  Social History   Social History  . Marital status: Single    Spouse name: N/A  . Number of children: N/A  . Years of education: N/A   Social History Main Topics  . Smoking status: Former Smoker    Types: Cigarettes    Quit date: 08/04/2015  . Smokeless tobacco: Never Used  . Alcohol use No  . Drug use: No  . Sexual activity: Yes    Birth control/ protection: Pill   Other Topics Concern  . None   Social History Narrative  . None    ROS  Objective: Vitals:   11/19/16 0910  BP: 122/62  Pulse: 79  Resp: 16  Temp: 97.6 F (36.4 C)  TempSrc: Oral  SpO2: 100%  Weight:  154 lb (69.9 kg)  Height: 5\' 3"  (1.6 m)    Physical Exam  Constitutional: She appears well-developed and well-nourished.  HENT:  Head: Normocephalic and atraumatic.  Pulmonary/Chest: Effort normal.   Rash on torso that is scaly and hyperpigmented Rash on face flat and ashen appearing Rash on scalp oily and scaly Various scars on torso from previous eruptions   Assessment and Plan Danielle Baldwin was seen today for rash.  Diagnoses and all orders for this visit:  Folliculitis  Acne vulgaris  Seborrheic dermatitis of scalp  Hyperpigmentation of skin  Other orders -     Benzoyl Peroxide 5 % lotion; Apply 1 application topically at bedtime. -     clindamycin (CLINDAGEL) 1 % gel; Apply topically 2 (two) times daily. -     hydrocortisone 2.5 % cream; Apply topically 2 (two) times daily. -     triamcinolone ointment (KENALOG) 0.5 %; Apply 1 application topically 2 (two) times daily.    Scalp- seborrheic dermatitis Use head and shoulders classic clean shampoo to wash Then apply 1 egg yolk with 2 tablespoons of 100% virgin olive oil rinse with cool water After twisting and blotting hair twist the hair and as it dries use coconut  oil or mineral oil to add sheen to the hair  For face Apply mild soaps - cetaphil soap or dove white bar soap  After cleansing- hydrocortisone 2.5 cream on the face (pea sized amount) for 2 weeks Benzoyl peroxide cream  For acne break out - cleocin lotion For sunscreen use neutrogena sensitive oil free spf 55%  For body Dove white bar soap to wash For this rash use triamcinolone ointment Use that twice a day  For Routine For a moisture apply eucerin to damp skin  If you get dry during the day mist water on the skin and reapply eucerin   For groin Apply Cleonic to the mons pubis to help heal the irritation No waxing  A total of 30 minutes were spent face-to-face with the patient during this encounter and over half of that time was spent on counseling  and coordination of care.   Danielle Baldwin A Danielle Baldwin

## 2016-11-19 NOTE — Patient Instructions (Addendum)
     IF you received an x-ray today, you will receive an invoice from [facility]. Please contact [facility] at [phone] with questions or concerns regarding your invoice.   IF you received labwork today, you will receive an invoice from [facility]. Please contact [facility] at [phone] with questions or concerns regarding your invoice.   Our billing staff will not be able to assist you with questions regarding bills from these companies.  You will be contacted with the lab results as soon as they are available. The fastest way to get your results is to activate your My Chart account. Instructions are located on the last page of this paperwork. If you have not heard from us regarding the results in 2 weeks, please contact this office.     Scalp- seborrheic dermatitis Use head and shoulders classic clean shampoo to wash Then apply 1 egg yolk with 2 tablespoons of 100% virgin olive oil rinse with cool water After twisting and blotting hair twist the hair and as it dries use coconut oil or mineral oil to add sheen to the hair  For face Apply mild soaps - cetaphil soap or dove white bar soap  After cleansing- hydrocortisone 2.5 cream on the face (pea sized amount) for 2 weeks Benzoyl peroxide cream  For acne break out - cleocin lotion For sunscreen use neutrogena sensitive oil free spf 55%  For body Dove white bar soap to wash For this rash use triamcinolone ointment Use that twice a day  For Routine For a moisture apply eucerin to damp skin  If you get dry during the day mist water on the skin and reapply eucerin   For groin Apply Cleonic to the mons pubis to help heal the irritation No waxing

## 2016-11-21 ENCOUNTER — Telehealth: Payer: Self-pay | Admitting: General Practice

## 2016-11-21 NOTE — Telephone Encounter (Signed)
See for creams listed Which one is for groin Which one for legs Which one for pimples  Which one for face and neck Is she supposed to use the keflex pills too?

## 2016-11-21 NOTE — Telephone Encounter (Signed)
Pt has questions about the creams she is using   Best number [phone]

## 2016-11-22 NOTE — Telephone Encounter (Signed)
Scalp- seborrheic dermatitis Use head and shoulders classic clean shampoo to wash Then apply 1 egg yolk with 2 tablespoons of 100% virgin olive oil rinse with cool water After twisting and blotting hair twist the hair and as it dries use coconut oil or mineral oil to add sheen to the hair  For face Apply mild soaps - cetaphil soap or dove white bar soap  After cleansing- hydrocortisone 2.5 cream on the face (pea sized amount) for 2 weeks Benzoyl peroxide cream  For acne break out - cleocin lotion For sunscreen use neutrogena sensitive oil free spf 55%  For body Dove white bar soap to wash For this rash use triamcinolone ointment Use that twice a day  For groin Apply Cleonic to the mons pubis to help heal the irritation No waxing

## 2016-11-23 NOTE — Telephone Encounter (Signed)
l/m with md note 

## 2016-12-21 ENCOUNTER — Ambulatory Visit (INDEPENDENT_AMBULATORY_CARE_PROVIDER_SITE_OTHER): Payer: BLUE CROSS/BLUE SHIELD | Admitting: Family Medicine

## 2016-12-21 VITALS — BP 132/79 | HR 94 | Temp 99.0°F | Resp 16 | Ht 64.0 in | Wt 153.4 lb

## 2016-12-21 DIAGNOSIS — L219 Seborrheic dermatitis, unspecified: Secondary | ICD-10-CM | POA: Diagnosis not present

## 2016-12-21 DIAGNOSIS — L732 Hidradenitis suppurativa: Secondary | ICD-10-CM | POA: Diagnosis not present

## 2016-12-21 DIAGNOSIS — L739 Follicular disorder, unspecified: Secondary | ICD-10-CM

## 2016-12-21 MED ORDER — DOXYCYCLINE HYCLATE 100 MG PO TABS: 100.0000 mg | ORAL_TABLET | Freq: Two times a day (BID) | ORAL | 0 refills | Status: DC

## 2016-12-21 NOTE — Progress Notes (Signed)
Chief Complaint  Patient presents with  . Boil    left armpit x 1 wk  . ingrown hair    pubic area since 11/19/16 Folliculitis    HPI  Pt reports that she has a boil in her left armpit that she has been using a warm compress on She states that it is tender but there is no fever or chills She stopped shaving She reports that she gets recurrent boils  She reports that her facial acne and body acne has improved since the last visit She no longer uses scented lotions or african black soap She is using the benzoyl peroxide and topical clindamycin regimen as instructed   Past Medical History:  Diagnosis Date  . Allergy     Current Outpatient Prescriptions  Medication Sig Dispense Refill  . Benzoyl Peroxide 5 % lotion Apply 1 application topically at bedtime. 30 mL 1  . clindamycin (CLINDAGEL) 1 % gel Apply topically 2 (two) times daily. 30 g 1  . Etonogestrel (NEXPLANON [place]) Inject into the skin.    . hydrocortisone 2.5 % cream Apply topically 2 (two) times daily. 30 g 0  . triamcinolone ointment (KENALOG) 0.5 % Apply 1 application topically 2 (two) times daily. 30 g 1  . doxycycline (VIBRA-TABS) 100 MG tablet Take 1 tablet (100 mg total) by mouth 2 (two) times daily. 20 tablet 0   No current facility-administered medications for this visit.     Allergies: No Known Allergies  No past surgical history on file.  Social History   Social History  . Marital status: Single    Spouse name: N/A  . Number of children: N/A  . Years of education: N/A   Social History Main Topics  . Smoking status: Former Smoker    Types: Cigarettes    Quit date: 08/04/2015  . Smokeless tobacco: Never Used  . Alcohol use No  . Drug use: No  . Sexual activity: Yes    Birth control/ protection: Pill   Other Topics Concern  . None   Social History Narrative  . None    Review of Systems  Constitutional: Negative for chills and fever.  Gastrointestinal: Negative for nausea and vomiting.     Objective: Vitals:   12/21/16 1623  BP: 132/79  Pulse: 94  Resp: 16  Temp: 99 F (37.2 C)  TempSrc: Oral  SpO2: 100%  Weight: 153 lb 6.4 oz (69.6 kg)  Height:  (1.626 m)    Physical Exam  Constitutional: She is oriented to person, place, and time. She appears well-developed and well-nourished.  Eyes: Conjunctivae and EOM are normal.  Pulmonary/Chest: Effort normal.  Neurological: She is alert and oriented to person, place, and time.   Skin:  Left axillae with area of induration with a palpable nodule measuring 1-2 cm Visible hyperpigmentation on torso from old acne scars Groin also hyperpigmented  Assessment and Plan [place] was seen today for boil and ingrown hair.  Diagnoses and all orders for this visit:  Folliculitis Continue benzoyl peroxide on mons pubis  Seborrheic dermatitis of scalp Advised head and shoulders classic clean  Hidradenitis suppurativa of left axilla Not ready for I&D Discussed risks and benefits Discussed using abx currently Other orders -     doxycycline (VIBRA-TABS) 100 MG tablet; Take 1 tablet (100 mg total) by mouth 2 (two) times daily.  A total of 30 minutes were spent face-to-face with the patient during this encounter and over half of that time was spent on counseling  and coordination of care. Explaining why I would not recommend I&D Also discussed how to heal scars Spent time reviewing strategies for nails and hairstyles    Moon Budde A Creta Levin

## 2016-12-21 NOTE — Patient Instructions (Addendum)
Antibiotic - You have received the antibiotic doxycycline. It may take several days before you feel any benefit from this medicine. Be sure to take all the medication prescribed, even if you are feeling better before it is finished. Do not drink alcohol while taking antibiotics.  It is unknown whether you will develop an adverse reaction (diarrhea, rash, nausea or vomiting) or an allergic reaction (trouble breathing, anaphylaxis).        IF you received an x-ray today, you will receive an invoice from [facility]. Please contact [facility] at [phone] with questions or concerns regarding your invoice.   IF you received labwork today, you will receive an invoice from [facility]. Please contact [facility] at [phone] with questions or concerns regarding your invoice.   Our billing staff will not be able to assist you with questions regarding bills from these companies.  You will be contacted with the lab results as soon as they are available. The fastest way to get your results is to activate your My Chart account. Instructions are located on the last page of this paperwork. If you have not heard from [place] regarding the results in 2 weeks, please contact this office.    Hidradenitis Suppurativa Hidradenitis suppurativa is a long-term (chronic) skin disease that starts with blocked sweat glands or hair follicles. Bacteria may grow in these blocked openings of your skin. Hidradenitis suppurativa is like a severe form of acne that develops in areas of your body where acne would be unusual. It is most likely to affect the areas of your body where skin rubs against skin and becomes moist. This includes your:  Underarms.  Groin.  Genital areas.  Buttocks.  Upper thighs.  Breasts. Hidradenitis suppurativa may start out with small pimples. The pimples can develop into deep sores that break open (rupture) and drain pus. Over time your skin may thicken and become scarred.  Hidradenitis suppurativa cannot be passed from person to person. What are the causes? The exact cause of hidradenitis suppurativa is not known. This condition may be due to:  Female and female hormones. The condition is rare before and after puberty.  An overactive body defense system (immune system). Your immune system may overreact to the blocked hair follicles or sweat glands and cause swelling and pus-filled sores. What increases the risk? You may have a higher risk of hidradenitis suppurativa if you:  Are a woman.  Are between ages 18 and 80.  Have a family history of hidradenitis suppurativa.  Have a personal history of acne.  Are overweight.  Smoke.  Take the drug lithium. What are the signs or symptoms? The first signs of an outbreak are usually painful skin bumps that look like pimples. As the condition progresses:  Skin bumps may get bigger and grow deeper into the skin.  Bumps under the skin may rupture and drain smelly pus.  Skin may become itchy and infected.  Skin may thicken and scar.  Drainage may continue through tunnels under the skin (fistulas).  Walking and moving your arms can become painful. How is this diagnosed? Your health care provider may diagnose hidradenitis suppurativa based on your medical history and your signs and symptoms. A physical exam will also be done. You may need to see a health care provider who specializes in skin diseases (dermatologist). You may also have tests done to confirm the diagnosis. These can include:  Swabbing a sample of pus or drainage from your skin so it can be sent to the lab  and tested for infection.  Blood tests to check for infection. How is this treated? The same treatment will not work for everybody with hidradenitis suppurativa. Your treatment will depend on how severe your symptoms are. You may need to try several treatments to find what works best for you. Part of your treatment may include cleaning and  bandaging (dressing) your wounds. You may also have to take medicines, such as the following:  Antibiotics.  Acne medicines.  Medicines to block or suppress the immune system.  A diabetes medicine (metformin) is sometimes used to treat this condition.  For women, birth control pills can sometimes help relieve symptoms. You may need surgery if you have a severe case of hidradenitis suppurativa that does not respond to medicine. Surgery may involve:  Using a laser to clear the skin and remove hair follicles.  Opening and draining deep sores.  Removing the areas of skin that are diseased and scarred. Follow these instructions at home:  Learn as much as you can about your disease, and work closely with your health care providers.  Take medicines only as directed by your health care provider.  If you were prescribed an antibiotic medicine, finish it all even if you start to feel better.  If you are overweight, losing weight may be very helpful. Try to reach and maintain a healthy weight.  Do not use any tobacco products, including cigarettes, chewing tobacco, or electronic cigarettes. If you need help quitting, ask your health care provider.  Do not shave the areas where you get hidradenitis suppurativa.  Do not wear deodorant.  Wear loose-fitting clothes.  Try not to overheat and get sweaty.  Take a daily bleach bath as directed by your health care provider.  Fill your bathtub halfway with water.  Pour in  cup of unscented household bleach.  Soak for 5-10 minutes.  Cover sore areas with a warm, clean washcloth (compress) for 5-10 minutes. Contact a health care provider if:  You have a flare-up of hidradenitis suppurativa.  You have chills or a fever.  You are having trouble controlling your symptoms at home. This information is not intended to replace advice given to you by your health care provider. Make sure you discuss any questions you have with your health care  provider. Document Released: 04/03/2004 Document Revised: 01/26/2016 Document Reviewed: 11/20/2013 Elsevier Interactive Patient Education  2017 [employer].

## 2017-04-09 DIAGNOSIS — Z3046 Encounter for surveillance of implantable subdermal contraceptive: Secondary | ICD-10-CM | POA: Diagnosis not present

## 2017-04-09 DIAGNOSIS — N760 Acute vaginitis: Secondary | ICD-10-CM | POA: Diagnosis not present

## 2017-04-09 DIAGNOSIS — Z113 Encounter for screening for infections with a predominantly sexual mode of transmission: Secondary | ICD-10-CM | POA: Diagnosis not present

## 2017-06-04 ENCOUNTER — Ambulatory Visit (INDEPENDENT_AMBULATORY_CARE_PROVIDER_SITE_OTHER): Payer: BLUE CROSS/BLUE SHIELD | Admitting: Family Medicine

## 2017-06-04 ENCOUNTER — Encounter: Payer: Self-pay | Admitting: Family Medicine

## 2017-06-04 ENCOUNTER — Ambulatory Visit (INDEPENDENT_AMBULATORY_CARE_PROVIDER_SITE_OTHER): Payer: BLUE CROSS/BLUE SHIELD

## 2017-06-04 VITALS — BP 114/62 | HR 82 | Temp 98.7°F | Resp 17 | Ht 64.0 in | Wt 162.0 lb

## 2017-06-04 DIAGNOSIS — M25571 Pain in right ankle and joints of right foot: Secondary | ICD-10-CM

## 2017-06-04 DIAGNOSIS — F321 Major depressive disorder, single episode, moderate: Secondary | ICD-10-CM | POA: Diagnosis not present

## 2017-06-04 DIAGNOSIS — L739 Follicular disorder, unspecified: Secondary | ICD-10-CM | POA: Diagnosis not present

## 2017-06-04 MED ORDER — CLINDAMYCIN PHOSPHATE 1 % EX GEL: Freq: Two times a day (BID) | CUTANEOUS | 1 refills | Status: DC

## 2017-06-04 MED ORDER — DOXYCYCLINE HYCLATE 100 MG PO TABS: 100.0000 mg | ORAL_TABLET | Freq: Two times a day (BID) | ORAL | 1 refills | Status: DC

## 2017-06-04 NOTE — Patient Instructions (Addendum)
   IF you received an x-ray today, you will receive an invoice from [facility]. Please contact [facility] at [phone] with questions or concerns regarding your invoice.   IF you received labwork today, you will receive an invoice from [facility]. Please contact [facility] at [phone] with questions or concerns regarding your invoice.   Our billing staff will not be able to assist you with questions regarding bills from these companies.  You will be contacted with the lab results as soon as they are available. The fastest way to get your results is to activate your My Chart account. Instructions are located on the last page of this paperwork. If you have not heard from us regarding the results in 2 weeks, please contact this office.     Folliculitis Folliculitis is inflammation of the hair follicles. Folliculitis most commonly occurs on the scalp, thighs, legs, back, and buttocks. However, it can occur anywhere on the body. What are the causes? This condition may be caused by:  A bacterial infection (common).  A fungal infection.  A viral infection.  Coming into contact with certain chemicals, especially oils and tars.  Shaving or waxing.  Applying greasy ointments or creams to your skin often.  Long-lasting folliculitis and folliculitis that keeps coming back can be caused by bacteria that live in the nostrils. What increases the risk? This condition is more likely to develop in people with:  A weakened immune system.  Diabetes.  Obesity.  What are the signs or symptoms? Symptoms of this condition include:  Redness.  Soreness.  Swelling.  Itching.  Small white or yellow, pus-filled, itchy spots (pustules) that appear over a reddened area. If there is an infection that goes deep into the follicle, these may develop into a boil (furuncle).  A group of closely packed boils (carbuncle). These tend to form in hairy, sweaty areas of the  body.  How is this diagnosed? This condition is diagnosed with a skin exam. To find what is causing the condition, your health care provider may take a sample of one of the pustules or boils for testing. How is this treated? This condition may be treated by:  Applying warm compresses to the affected areas.  Taking an antibiotic medicine or applying an antibiotic medicine to the skin.  Applying or bathing with an antiseptic solution.  Taking an over-the-counter medicine to help with itching.  Having a procedure to drain any pustules or boils. This may be done if a pustule or boil contains a lot of pus or fluid.  Laser hair removal. This may be done to treat long-lasting folliculitis.  Follow these instructions at home:  If directed, apply heat to the affected area as often as told by your health care provider. Use the heat source that your health care provider recommends, such as a moist heat pack or a heating pad. ? Place a towel between your skin and the heat source. ? Leave the heat on for 20-30 minutes. ? Remove the heat if your skin turns bright red. This is especially important if you are unable to feel pain, heat, or cold. You may have a greater risk of getting burned.  If you were prescribed an antibiotic medicine, use it as told by your health care provider. Do not stop using the antibiotic even if you start to feel better.  Take over-the-counter and prescription medicines only as told by your health care provider.  Do not shave irritated skin.  Keep all follow-up visits   as told by your health care provider. This is important. Get help right away if:  You have more redness, swelling, or pain in the affected area.  Red streaks are spreading from the affected area.  You have a fever. This information is not intended to replace advice given to you by your health care provider. Make sure you discuss any questions you have with your health care provider. Document Released:  10/29/2001 Document Revised: 03/09/2016 Document Reviewed: 06/10/2015 Elsevier Interactive Patient Education  2018 [employer]. Joint Pain Joint pain, which is also called arthralgia, can be caused by many things. Joint pain often goes away when you follow your health care provider's instructions for relieving pain at home. However, joint pain can also be caused by conditions that require further treatment. Common causes of joint pain include:  Bruising in the area of the joint.  Overuse of the joint.  Wear and tear on the joints that occur with aging (osteoarthritis).  Various other forms of arthritis.  A buildup of a crystal form of uric acid in the joint (gout).  Infections of the joint (septic arthritis) or of the bone (osteomyelitis).  Your health care provider may recommend medicine to help with the pain. If your joint pain continues, additional tests may be needed to diagnose your condition. Follow these instructions at home: Watch your condition for any changes. Follow these instructions as directed to lessen the pain that you are feeling.  Take medicines only as directed by your health care provider.  Rest the affected area for as long as your health care provider says that you should. If directed to do so, raise the painful joint above the level of your heart while you are sitting or lying down.  Do not do things that cause or worsen pain.  If directed, apply ice to the painful area: ? Put ice in a plastic bag. ? Place a towel between your skin and the bag. ? Leave the ice on for 20 minutes, 2-3 times per day.  Wear an elastic bandage, splint, or sling as directed by your health care provider. Loosen the elastic bandage or splint if your fingers or toes become numb and tingle, or if they turn cold and blue.  Begin exercising or stretching the affected area as directed by your health care provider. Ask your health care provider what types of exercise are safe for  you.  Keep all follow-up visits as directed by your health care provider. This is important.  Contact a health care provider if:  Your pain increases, and medicine does not help.  Your joint pain does not improve within 3 days.  You have increased bruising or swelling.  You have a fever.  You lose 10 lb (4.5 kg) or more without trying. Get help right away if:  You are not able to move the joint.  Your fingers or toes become numb or they turn cold and blue. This information is not intended to replace advice given to you by your health care provider. Make sure you discuss any questions you have with your health care provider. Document Released: 08/20/2005 Document Revised: 01/20/2016 Document Reviewed: 06/01/2014 Elsevier Interactive Patient Education  [employer].

## 2017-06-04 NOTE — Progress Notes (Signed)
Chief Complaint  Patient presents with  . Pain    right foot, pt used to play soccer-? cause, pt has to walk and sit with foot twisted to not have pain, pain with walking  that radiates into ankle, knee and hip,  pain level 4/10, foot pops often,   . bumps    hair bumps in groin area pt would like some medication or cream  to help    HPI   Pt reports that she has been dealing with depression for 3 years She was afraid to talk about it because she thought it would prevent her from going in the military She states that she is open to counseling but not medications She states that she is not aware of a family history of depression  Depression screen [facility] 2/9 06/04/2017 12/21/2016 11/19/2016 02/16/2016 02/14/2016  Decreased Interest 3 0 0 0 0  Down, Depressed, Hopeless 2 0 0 0 0  PHQ - 2 Score 5 0 0 0 0  Altered sleeping 3 - - - -  Tired, decreased energy 3 - - - -  Change in appetite 3 - - - -  Feeling bad or failure about yourself  2 - - - -  Trouble concentrating 3 - - - -  Moving slowly or fidgety/restless 0 - - - -  Suicidal thoughts 0 - - - -  PHQ-9 Score 19 - - - -  Difficult doing work/chores Very difficult - - - -   Folliculitis Pt reports that she has repeated bumps with pain along her hair line in the vaginal area but mostly on her mons pubis She states that she does not shave and only occasionally waxes She gets breakouts despite carefully selecting hypoallergenic soaps and detergents  Pain in right ankle and foot She reports that she had an injury in high school while playing soccer She now feels like she has a deformity of right ankle She states that she gets pain with standing or running.  Her feet don't point in the same direction.    Past Medical History:  Diagnosis Date  . Allergy     Current Outpatient Prescriptions  Medication Sig Dispense Refill  . Etonogestrel (NEXPLANON [place]) Inject into the skin.    . Benzoyl Peroxide 5 % lotion Apply 1 application  topically at bedtime. (Patient not taking: Reported on 06/04/2017) 30 mL 1  . clindamycin (CLINDAGEL) 1 % gel Apply topically 2 (two) times daily. 30 g 1  . doxycycline (VIBRA-TABS) 100 MG tablet Take 1 tablet (100 mg total) by mouth 2 (two) times daily. 20 tablet 1  . hydrocortisone 2.5 % cream Apply topically 2 (two) times daily. (Patient not taking: Reported on 06/04/2017) 30 g 0  . triamcinolone ointment (KENALOG) 0.5 % Apply 1 application topically 2 (two) times daily. (Patient not taking: Reported on 06/04/2017) 30 g 1   No current facility-administered medications for this visit.     Allergies: No Known Allergies  No past surgical history on file.  Social History   Social History  . Marital status: Single    Spouse name: N/A  . Number of children: N/A  . Years of education: N/A   Social History Main Topics  . Smoking status: Former Smoker    Types: Cigarettes    Quit date: 08/04/2015  . Smokeless tobacco: Never Used  . Alcohol use No  . Drug use: No  . Sexual activity: Yes    Birth control/ protection: Pill   Other  Topics Concern  . None   Social History Narrative  . None    Review of Systems  Constitutional: Negative for chills, fever and weight loss.  Eyes: Negative for blurred vision, double vision and photophobia.  Respiratory: Negative for cough, shortness of breath and wheezing.   Cardiovascular: Negative for chest pain, palpitations and leg swelling.  Gastrointestinal: Negative for abdominal pain, constipation, diarrhea, nausea and vomiting.  Skin: Negative for itching and rash.  Neurological: Negative for dizziness, tingling and headaches.  Psychiatric/Behavioral: Positive for depression. The patient is not nervous/anxious and does not have insomnia.     Objective: Vitals:   06/04/17 1027  BP: 114/62  Pulse: 82  Resp: 17  Temp: 98.7 F (37.1 C)  TempSrc: Oral  SpO2: 99%  Weight: 162 lb (73.5 kg)  Height:  (1.626 m)    Physical Exam    Constitutional: She is oriented to person, place, and time. She appears well-developed and well-nourished.  HENT:  Head: Normocephalic and atraumatic.  Eyes: Conjunctivae and EOM are normal.  Cardiovascular: Normal rate, regular rhythm and normal heart sounds.   Pulmonary/Chest: Effort normal and breath sounds normal. No respiratory distress. She has no wheezes. She has no rales.  Neurological: She is alert and oriented to person, place, and time.  Skin:  Various scars and healed lesions, thick coarse hair on mons pubis.    Assessment and Plan Danielle Baldwin was seen today for pain and bumps.  Diagnoses and all orders for this visit:  Pain in joint involving right ankle and foot- advised follow up with Podiatry -     DG Ankle Complete Right -     Ambulatory referral to Podiatry  Folliculitis- advised pt to use clindagel and doxycycline oral for the next breakout -     clindamycin (CLINDAGEL) 1 % gel; Apply topically 2 (two) times daily. -     doxycycline (VIBRA-TABS) 100 MG tablet; Take 1 tablet (100 mg total) by mouth 2 (two) times daily.  Major depressive disorder, single episode, moderate (HCC)- discussed screening for underlying factors  Discussed starting medications which patient declined Advised counseling  -     Ambulatory referral to Psychiatry -     TSH -     Comprehensive metabolic panel -     CBC -     VITAMIN D 25 Hydroxy (Vit-D Deficiency, Fractures)      Danielle Baldwin A Creta Levin

## 2017-06-05 LAB — COMPREHENSIVE METABOLIC PANEL
A/G RATIO: 1.8 (ref 1.2–2.2)
ALBUMIN: 4.6 g/dL (ref 3.5–5.5)
ALT: 10 IU/L (ref 0–32)
AST: 12 IU/L (ref 0–40)
Alkaline Phosphatase: 56 IU/L (ref 39–117)
BILIRUBIN TOTAL: 0.2 mg/dL (ref 0.0–1.2)
BUN/Creatinine Ratio: 16 (ref 9–23)
BUN: 13 mg/dL (ref 6–20)
CALCIUM: 8.9 mg/dL (ref 8.7–10.2)
CO2: 20 mmol/L (ref 20–29)
Chloride: 106 mmol/L (ref 96–106)
Creatinine, Ser: 0.81 mg/dL (ref 0.57–1.00)
GFR calc Af Amer: 118 mL/min/{1.73_m2} (ref >59)
GFR, EST NON AFRICAN AMERICAN: 102 mL/min/{1.73_m2} (ref >59)
GLOBULIN, TOTAL: 2.5 g/dL (ref 1.5–4.5)
Glucose: 95 mg/dL (ref 65–99)
POTASSIUM: 4.2 mmol/L (ref 3.5–5.2)
Sodium: 141 mmol/L (ref 134–144)
TOTAL PROTEIN: 7.1 g/dL (ref 6.0–8.5)

## 2017-06-05 LAB — CBC
HEMATOCRIT: 40.4 % (ref 34.0–46.6)
HEMOGLOBIN: 13.4 g/dL (ref 11.1–15.9)
MCH: 32.1 pg (ref 26.6–33.0)
MCHC: 33.2 g/dL (ref 31.5–35.7)
MCV: 97 fL (ref 79–97)
Platelets: 209 10*3/uL (ref 150–379)
RBC: 4.18 x10E6/uL (ref 3.77–5.28)
RDW: 12.8 % (ref 12.3–15.4)
WBC: 4.7 10*3/uL (ref 3.4–10.8)

## 2017-06-05 LAB — VITAMIN D 25 HYDROXY (VIT D DEFICIENCY, FRACTURES): VIT D 25 HYDROXY: 16.4 ng/mL — AB (ref 30.0–100.0)

## 2017-06-05 LAB — TSH: TSH: 0.993 u[IU]/mL (ref 0.450–4.500)

## 2017-06-19 ENCOUNTER — Encounter: Payer: Self-pay | Admitting: Family Medicine

## 2017-06-19 ENCOUNTER — Ambulatory Visit (INDEPENDENT_AMBULATORY_CARE_PROVIDER_SITE_OTHER): Payer: BLUE CROSS/BLUE SHIELD | Admitting: Family Medicine

## 2017-06-19 VITALS — BP 114/72 | HR 103 | Temp 99.9°F | Resp 17 | Ht 64.0 in | Wt 159.0 lb

## 2017-06-19 DIAGNOSIS — J029 Acute pharyngitis, unspecified: Secondary | ICD-10-CM | POA: Diagnosis not present

## 2017-06-19 DIAGNOSIS — J020 Streptococcal pharyngitis: Secondary | ICD-10-CM

## 2017-06-19 LAB — POCT RAPID STREP A (OFFICE): RAPID STREP A SCREEN: POSITIVE — AB

## 2017-06-19 MED ORDER — PENICILLIN V POTASSIUM 500 MG PO TABS: 500.0000 mg | ORAL_TABLET | Freq: Three times a day (TID) | ORAL | 0 refills | Status: AC

## 2017-06-19 NOTE — Progress Notes (Signed)
Chief Complaint  Patient presents with  . Sore Throat    onset: Saturday, hot to touch last night, no temps taken. Pt has lg temp today of 99.9  . right ear pain    onset: Saturday, really bad last night felt like ear was going to pop, pt has hx of ear pain    HPI   Pt reports that she has been having temperatures sore throat with right ear pain She denies sick contacts Onset was 5 days ago She has tried sprays, lozenges, tea, honey, dayquil and nyquil  She has had no improvement in her symptoms   She reports that she has been also have a month of loose stools that is unrelated to the sore throat  Past Medical History:  Diagnosis Date  . Allergy     Current Outpatient Prescriptions  Medication Sig Dispense Refill  . clindamycin (CLINDAGEL) 1 % gel Apply topically 2 (two) times daily. 30 g 1  . Etonogestrel (NEXPLANON [place]) Inject into the skin.    . Benzoyl Peroxide 5 % lotion Apply 1 application topically at bedtime. (Patient not taking: Reported on 06/04/2017) 30 mL 1  . hydrocortisone 2.5 % cream Apply topically 2 (two) times daily. (Patient not taking: Reported on 06/04/2017) 30 g 0  . penicillin v potassium (VEETID) 500 MG tablet Take 1 tablet (500 mg total) by mouth 3 (three) times daily. 30 tablet 0  . triamcinolone ointment (KENALOG) 0.5 % Apply 1 application topically 2 (two) times daily. (Patient not taking: Reported on 06/04/2017) 30 g 1   No current facility-administered medications for this visit.     Allergies: No Known Allergies  No past surgical history on file.  Social History   Social History  . Marital status: Single    Spouse name: N/A  . Number of children: N/A  . Years of education: N/A   Social History Main Topics  . Smoking status: Former Smoker    Types: Cigarettes    Quit date: 08/04/2015  . Smokeless tobacco: Never Used  . Alcohol use No  . Drug use: No  . Sexual activity: Yes    Birth control/ protection: Pill   Other Topics Concern  .  None   Social History Narrative  . None    Review of Systems  Constitutional: Negative for chills and fever.  Respiratory: Negative for cough, shortness of breath and wheezing.   Cardiovascular: Negative for chest pain and palpitations.  Gastrointestinal: Positive for diarrhea. Negative for nausea and vomiting.    Objective: Vitals:   06/19/17 1035  BP: 114/72  Pulse: (!) 103  Resp: 17  Temp: 99.9 F (37.7 C)  TempSrc: Oral  SpO2: 96%  Weight: 159 lb (72.1 kg)  Height: 5\' 4"  (1.626 m)    Physical Exam General: alert, oriented, in NAD Head: normocephalic, atraumatic, no sinus tenderness Eyes: EOM intact, no scleral icterus or conjunctival injection Ears: TM clear bilaterally Nose: mucosa nonerythematous, nonedematous Throat: + pharyngeal exudate and erythema Lymph: no posterior auricular, submental or cervical lymph adenopathy Heart: normal rate, normal sinus rhythm, no murmurs Lungs: clear to auscultation bilaterally, no wheezing   Rapid Strep positive   Assessment and Plan Kelleigh was seen today for sore throat and right ear pain.  Diagnoses and all orders for this visit:  Acute pharyngitis, unspecified etiology -     POCT rapid strep A -     penicillin v potassium (VEETID) 500 MG tablet; Take 1 tablet (500 mg total) by mouth  3 (three) times daily.  Strep pharyngitis -     penicillin v potassium (VEETID) 500 MG tablet; Take 1 tablet (500 mg total) by mouth 3 (three) times daily.   Supportive care and antibiotics Gave work note Pt advised not to mix alcohol with abx (she is going on a girls trip) Advised to take imodium to help with loose stools since she will be on penicillin   Lanorris Kalisz A Creta Levin

## 2017-06-19 NOTE — Patient Instructions (Addendum)
   IF you received an x-ray today, you will receive an invoice from [facility]. Please contact [facility] at [phone] with questions or concerns regarding your invoice.   IF you received labwork today, you will receive an invoice from [facility]. Please contact [facility] at [phone] with questions or concerns regarding your invoice.   Our billing staff will not be able to assist you with questions regarding bills from these companies.  You will be contacted with the lab results as soon as they are available. The fastest way to get your results is to activate your My Chart account. Instructions are located on the last page of this paperwork. If you have not heard from us regarding the results in 2 weeks, please contact this office.     Strep Throat Strep throat is a bacterial infection of the throat. Your health care provider may call the infection tonsillitis or pharyngitis, depending on whether there is swelling in the tonsils or at the back of the throat. Strep throat is most common during the cold months of the year in children who are 5-15 years of age, but it can happen during any season in people of any age. This infection is spread from person to person (contagious) through coughing, sneezing, or close contact. What are the causes? Strep throat is caused by the bacteria called Streptococcus pyogenes. What increases the risk? This condition is more likely to develop in:  People who spend time in crowded places where the infection can spread easily.  People who have close contact with someone who has strep throat.  What are the signs or symptoms? Symptoms of this condition include:  Fever or chills.  Redness, swelling, or pain in the tonsils or throat.  Pain or difficulty when swallowing.  White or yellow spots on the tonsils or throat.  Swollen, tender glands in the neck or under the jaw.  Red rash all over the body (rare).  How is this  diagnosed? This condition is diagnosed by performing a rapid strep test or by taking a swab of your throat (throat culture test). Results from a rapid strep test are usually ready in a few minutes, but throat culture test results are available after one or two days. How is this treated? This condition is treated with antibiotic medicine. Follow these instructions at home: Medicines  Take over-the-counter and prescription medicines only as told by your health care provider.  Take your antibiotic as told by your health care provider. Do not stop taking the antibiotic even if you start to feel better.  Have family members who also have a sore throat or fever tested for strep throat. They may need antibiotics if they have the strep infection. Eating and drinking  Do not share food, drinking cups, or personal items that could cause the infection to spread to other people.  If swallowing is difficult, try eating soft foods until your sore throat feels better.  Drink enough fluid to keep your urine clear or pale yellow. General instructions  Gargle with a salt-water mixture 3-4 times per day or as needed. To make a salt-water mixture, completely dissolve -1 tsp of salt in 1 cup of warm water.  Make sure that all household members wash their hands well.  Get plenty of rest.  Stay home from school or work until you have been taking antibiotics for 24 hours.  Keep all follow-up visits as told by your health care provider. This is important. Contact a health care provider   if:  The glands in your neck continue to get bigger.  You develop a rash, cough, or earache.  You cough up a thick liquid that is green, yellow-brown, or bloody.  You have pain or discomfort that does not get better with medicine.  Your problems seem to be getting worse rather than better.  You have a fever. Get help right away if:  You have new symptoms, such as vomiting, severe headache, stiff or painful neck,  chest pain, or shortness of breath.  You have severe throat pain, drooling, or changes in your voice.  You have swelling of the neck, or the skin on the neck becomes red and tender.  You have signs of dehydration, such as fatigue, dry mouth, and decreased urination.  You become increasingly sleepy, or you cannot wake up completely.  Your joints become red or painful. This information is not intended to replace advice given to you by your health care provider. Make sure you discuss any questions you have with your health care provider. Document Released: 08/17/2000 Document Revised: 04/18/2016 Document Reviewed: 12/13/2014 Elsevier Interactive Patient Education  2017 Elsevier Inc.  

## 2017-06-25 ENCOUNTER — Ambulatory Visit (INDEPENDENT_AMBULATORY_CARE_PROVIDER_SITE_OTHER): Payer: BLUE CROSS/BLUE SHIELD

## 2017-06-25 ENCOUNTER — Ambulatory Visit (INDEPENDENT_AMBULATORY_CARE_PROVIDER_SITE_OTHER): Payer: BLUE CROSS/BLUE SHIELD | Admitting: Podiatry

## 2017-06-25 VITALS — BP 123/73 | Ht 63.0 in | Wt 153.0 lb

## 2017-06-25 DIAGNOSIS — M79671 Pain in right foot: Secondary | ICD-10-CM

## 2017-06-25 DIAGNOSIS — M7671 Peroneal tendinitis, right leg: Secondary | ICD-10-CM

## 2017-06-25 DIAGNOSIS — M205X1 Other deformities of toe(s) (acquired), right foot: Secondary | ICD-10-CM | POA: Diagnosis not present

## 2017-06-25 NOTE — Progress Notes (Signed)
   Subjective:    Patient ID: Danielle PoissonMyeka Baldwin, female    DOB: 04/21/1991, 26 y.o.   MRN: [id number]  HPI This patient presents to the office for examination of her right ankle.  She says she is occasionally painful after excessive activity and walking.  She says she hears popping in her right ankle.  She says she feels pulling below her outside ankle bone.  She used to play soccer which she says may be causing this discomfort.  .  She was seen at the [facility] and xrays  Were taken of her right ankle and no pathology was noted.  She presents to the office for evaluation of her right foot/ankle Chief Complaint  Patient presents with  . Foot Pain    Right ankle pain/ foot "turns in"   . Difficulty Walking    Right ankle starts popping after walking a long time, foot pain begins       Review of Systems  All other systems reviewed and are negative.      Objective:   Physical Exam General Appearance  Alert, conversant and in no acute stress.  Vascular  Dorsalis pedis and posterior pulses are palpable  bilaterally.  Capillary return is within normal limits  Bilaterally. Temperature is within normal limits  Bilaterally  Neurologic  Senn-Weinstein monofilament wire test within normal limits  bilaterally. Muscle power  Within normal limits bilaterally.  Nails Normal nails noted with no fungal or bacterial elements.  Orthopedic  No limitations of motion of motion feet bilaterally.  No crepitus or effusions noted.  No bony pathology or digital deformities noted. No pain right ankle or foot.  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  Skin  normotropic skin with no porokeratosis noted bilaterally.  No signs of infections or ulcers noted.          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   IE  Xrays reveal no bony pathology.  Xrays reviewed of her right ankle and no pathology noted.   My examination revealed functional hallux  limitus 1st MPJ right.  She stands for me to examine her right foot and she says she has pulling extending along the outside of her right foot/ankle.  Prescribed powerstep insoles to be worn.  RTC prn.   Helane GuntherGregory Tamel Abel DPM

## 2017-06-26 ENCOUNTER — Telehealth: Payer: Self-pay | Admitting: *Deleted

## 2017-06-26 NOTE — Telephone Encounter (Signed)
I informed pt Dr. Stacie AcresMayer had instructed me to tell her to let pain be her guide, if she had pain to back off on the amount of time, speed she exercised on her feet, and no hills, and definitely wear her inserts. I told pt to gradually work back up to her exercise routine. Pt states understanding.

## 2017-06-26 NOTE — Telephone Encounter (Signed)
-----   Message from Belvuehristian Sewell sent at 06/25/2017  4:04 PM EDT ----- Regarding: Recent patient seen today 06/25/2017 Contact: [phone] Patient is curious if she can work out. If not, what can she do?

## 2017-07-03 ENCOUNTER — Other Ambulatory Visit: Payer: Self-pay | Admitting: Podiatry

## 2017-07-03 DIAGNOSIS — M7671 Peroneal tendinitis, right leg: Secondary | ICD-10-CM

## 2017-07-12 DIAGNOSIS — F331 Major depressive disorder, recurrent, moderate: Secondary | ICD-10-CM | POA: Diagnosis not present

## 2017-07-12 DIAGNOSIS — F411 Generalized anxiety disorder: Secondary | ICD-10-CM | POA: Diagnosis not present

## 2017-07-29 DIAGNOSIS — F331 Major depressive disorder, recurrent, moderate: Secondary | ICD-10-CM | POA: Diagnosis not present

## 2017-07-29 DIAGNOSIS — F411 Generalized anxiety disorder: Secondary | ICD-10-CM | POA: Diagnosis not present

## 2017-08-09 DIAGNOSIS — Z113 Encounter for screening for infections with a predominantly sexual mode of transmission: Secondary | ICD-10-CM | POA: Diagnosis not present

## 2017-08-09 DIAGNOSIS — Z3046 Encounter for surveillance of implantable subdermal contraceptive: Secondary | ICD-10-CM | POA: Diagnosis not present

## 2017-08-09 DIAGNOSIS — N760 Acute vaginitis: Secondary | ICD-10-CM | POA: Diagnosis not present

## 2017-09-09 DIAGNOSIS — Z3046 Encounter for surveillance of implantable subdermal contraceptive: Secondary | ICD-10-CM | POA: Diagnosis not present

## 2017-09-17 ENCOUNTER — Telehealth: Payer: Self-pay | Admitting: Family Medicine

## 2017-09-17 ENCOUNTER — Telehealth: Payer: Self-pay

## 2017-09-17 DIAGNOSIS — L732 Hidradenitis suppurativa: Secondary | ICD-10-CM

## 2017-09-17 NOTE — Telephone Encounter (Signed)
Please advise 

## 2017-09-17 NOTE — Telephone Encounter (Signed)
Copied from CRM [id number]. Topic: Referral - Request >> Sep 17, 2017 12:17 PM Diana EvesHoyt, Maryann B wrote: Reason for CRM: pt requesting a referral for dermatologist

## 2017-09-24 DIAGNOSIS — F411 Generalized anxiety disorder: Secondary | ICD-10-CM | POA: Diagnosis not present

## 2017-09-24 DIAGNOSIS — F331 Major depressive disorder, recurrent, moderate: Secondary | ICD-10-CM | POA: Diagnosis not present

## 2017-09-24 NOTE — Telephone Encounter (Signed)
Spoke with pt and she did not have preference for Dermatologist. I told her [facility] since they do not schedule too far out. I advised the pt of Dr. Noberto RetortStalling's message and gave her [facility] phone number as well.

## 2017-09-24 NOTE — Addendum Note (Signed)
Addended by: Collie SiadSTALLINGS, Abryana Lykens A on: 09/24/2017 02:00 PM   Modules accepted: Orders

## 2017-11-02 DIAGNOSIS — B360 Pityriasis versicolor: Secondary | ICD-10-CM | POA: Diagnosis not present

## 2017-11-02 DIAGNOSIS — L700 Acne vulgaris: Secondary | ICD-10-CM | POA: Diagnosis not present

## 2017-11-02 DIAGNOSIS — L738 Other specified follicular disorders: Secondary | ICD-10-CM | POA: Diagnosis not present

## 2018-05-07 DIAGNOSIS — L304 Erythema intertrigo: Secondary | ICD-10-CM | POA: Diagnosis not present

## 2018-05-07 DIAGNOSIS — L564 Polymorphous light eruption: Secondary | ICD-10-CM | POA: Diagnosis not present

## 2018-05-07 DIAGNOSIS — L732 Hidradenitis suppurativa: Secondary | ICD-10-CM | POA: Diagnosis not present

## 2018-05-07 DIAGNOSIS — L700 Acne vulgaris: Secondary | ICD-10-CM | POA: Diagnosis not present

## 2018-06-07 IMAGING — DX DG ANKLE COMPLETE 3+V*R*
4 series · 4 of 4 positions shown · non-contrast
Comparison: None in PACs

CLINICAL DATA: Chronic ankle pain, gait abnormality.

EXAM:
RIGHT ANKLE - COMPLETE 3+ VIEW

[ankle ap]
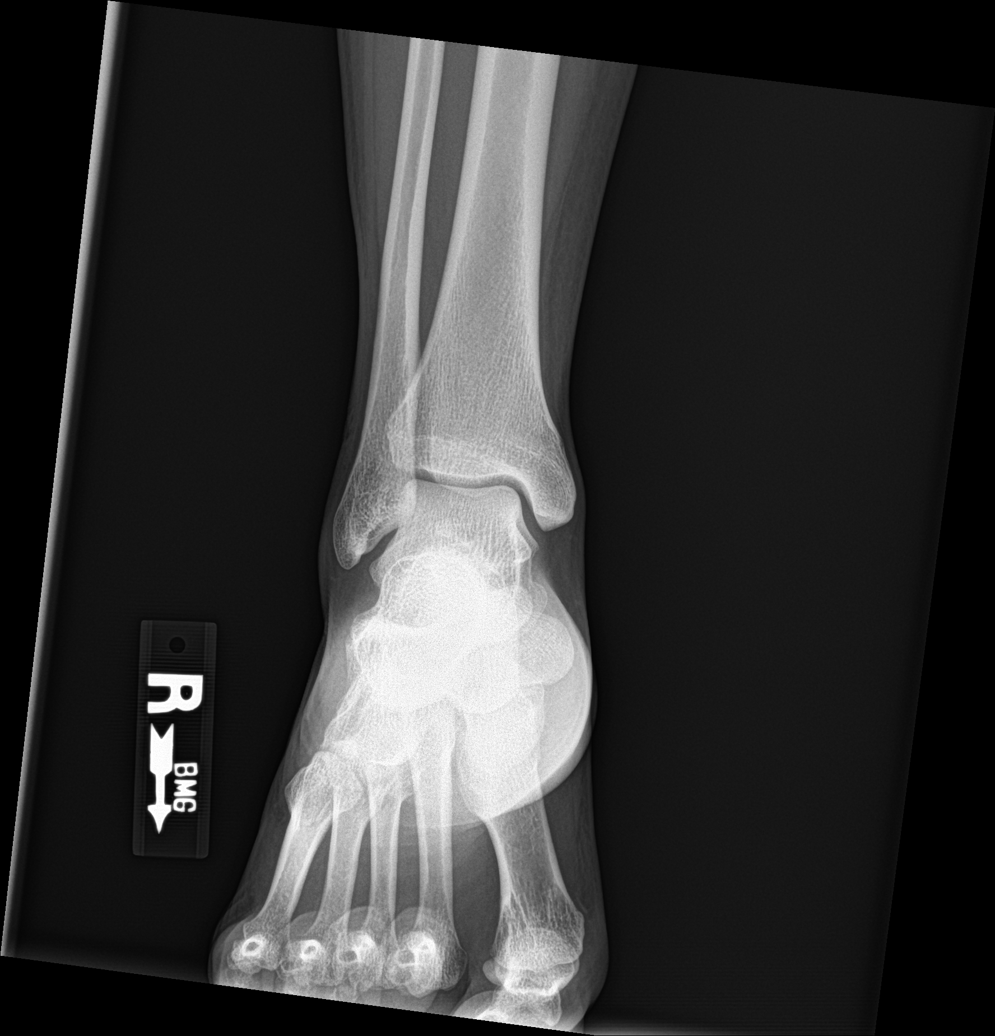

[ankle obl (1 of 2)]
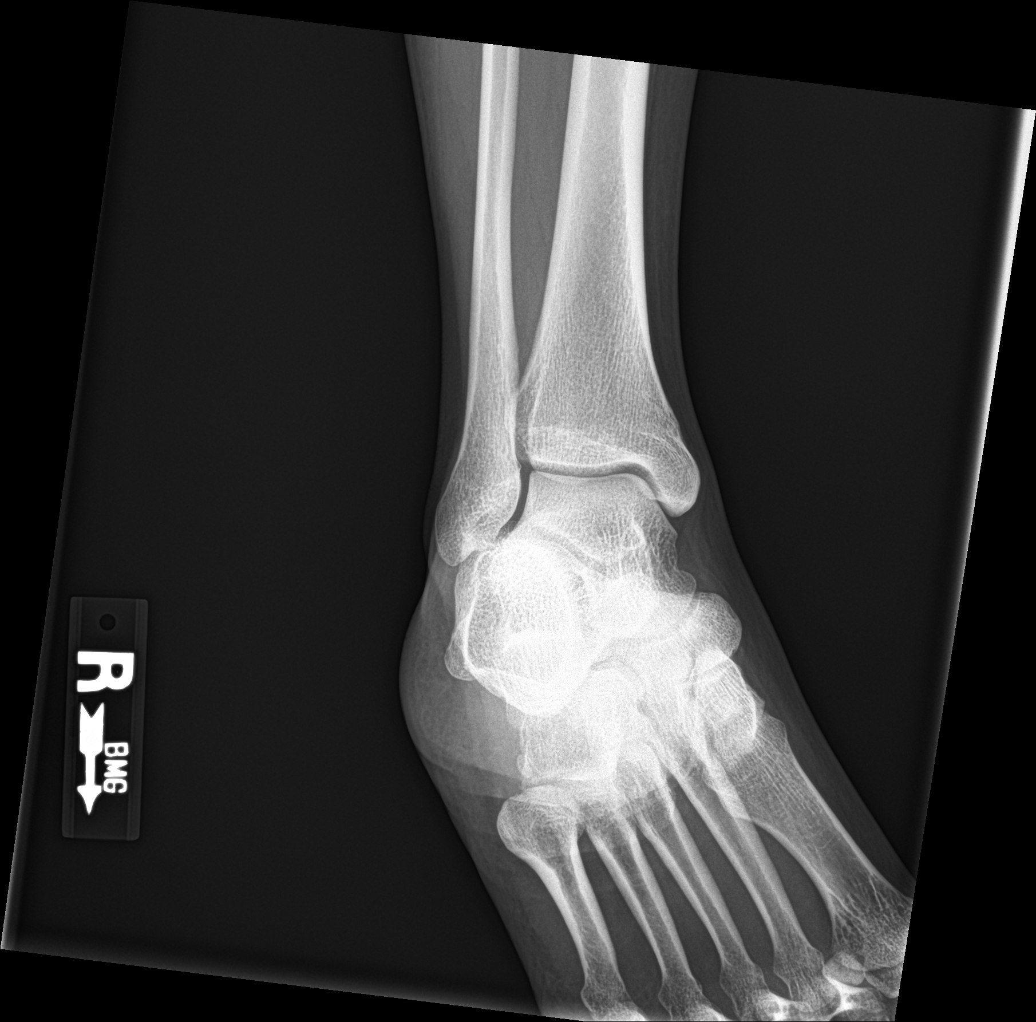

[ankle lat]
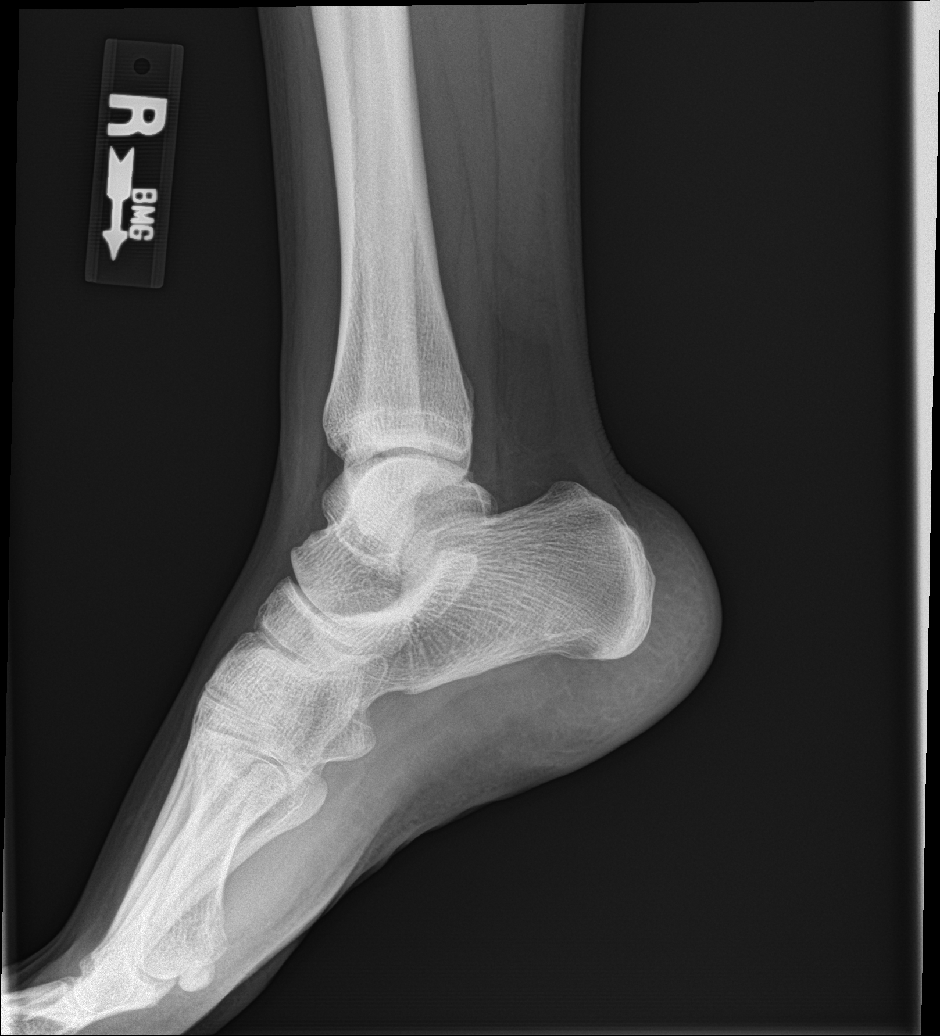

[ankle obl (2 of 2)]
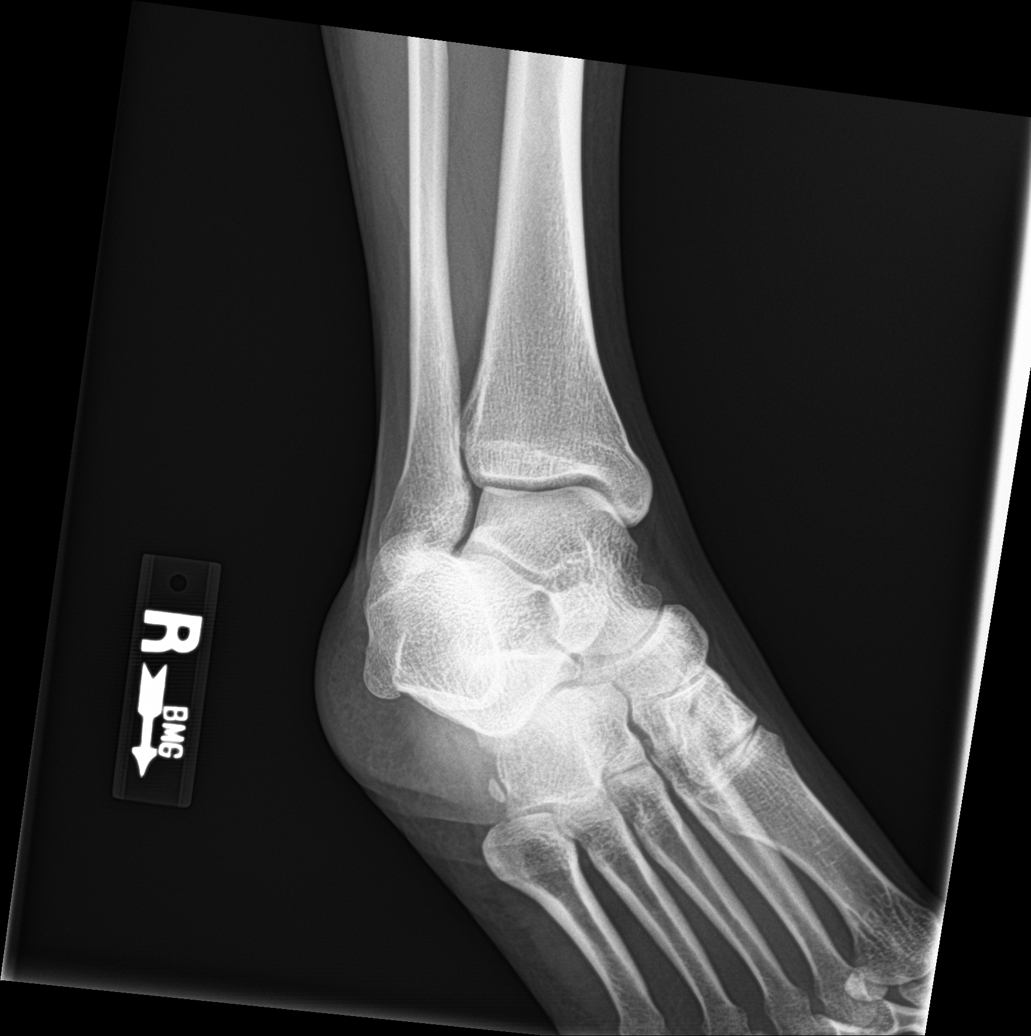

[4 of 4 positions shown; findings below may reference images not displayed]

FINDINGS: The bones are subjectively adequately mineralized. The ankle joint
mortise is preserved. The talar dome is intact. The talus and
calcaneus exhibit no abnormalities elsewhere. No acute malleolar
fracture is observed. The observed metatarsal bases are normal. The
soft tissues are unremarkable.
IMPRESSION: There is no acute or significant chronic bony abnormality of the
right ankle.

## 2018-07-29 ENCOUNTER — Other Ambulatory Visit: Payer: Self-pay

## 2018-07-29 ENCOUNTER — Ambulatory Visit: Payer: BLUE CROSS/BLUE SHIELD | Admitting: Family Medicine

## 2018-07-29 ENCOUNTER — Encounter: Payer: Self-pay | Admitting: Family Medicine

## 2018-07-29 VITALS — BP 133/89 | HR 72 | Temp 98.0°F | Ht 63.0 in | Wt 180.3 lb

## 2018-07-29 DIAGNOSIS — Z0289 Encounter for other administrative examinations: Secondary | ICD-10-CM

## 2018-07-29 DIAGNOSIS — Z7189 Other specified counseling: Secondary | ICD-10-CM

## 2018-07-29 NOTE — Patient Instructions (Signed)
° ° ° °  If you have lab work done today you will be contacted with your lab results within the next 2 weeks.  If you have not heard from us then please contact us. The fastest way to get your results is to register for My Chart. ° ° °IF you received an x-ray today, you will receive an invoice from [facility]. Please contact [facility] at [phone] with questions or concerns regarding your invoice.  ° °IF you received labwork today, you will receive an invoice from [facility]. Please contact [facility] at [phone] with questions or concerns regarding your invoice.  ° °Our billing staff will not be able to assist you with questions regarding bills from these companies. ° °You will be contacted with the lab results as soon as they are available. The fastest way to get your results is to activate your My Chart account. Instructions are located on the last page of this paperwork. If you have not heard from us regarding the results in 2 weeks, please contact this office. °  ° ° ° °
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
## 2018-09-08 DIAGNOSIS — L732 Hidradenitis suppurativa: Secondary | ICD-10-CM | POA: Diagnosis not present

## 2018-09-08 DIAGNOSIS — L700 Acne vulgaris: Secondary | ICD-10-CM | POA: Diagnosis not present

## 2018-09-08 DIAGNOSIS — L304 Erythema intertrigo: Secondary | ICD-10-CM | POA: Diagnosis not present

## 2018-09-23 DIAGNOSIS — M67371 Transient synovitis, right ankle and foot: Secondary | ICD-10-CM | POA: Diagnosis not present

## 2018-09-23 DIAGNOSIS — M84374A Stress fracture, right foot, initial encounter for fracture: Secondary | ICD-10-CM | POA: Diagnosis not present

## 2018-09-23 DIAGNOSIS — M7671 Peroneal tendinitis, right leg: Secondary | ICD-10-CM | POA: Diagnosis not present

## 2018-09-30 DIAGNOSIS — M84374D Stress fracture, right foot, subsequent encounter for fracture with routine healing: Secondary | ICD-10-CM | POA: Diagnosis not present

## 2018-09-30 DIAGNOSIS — M7671 Peroneal tendinitis, right leg: Secondary | ICD-10-CM | POA: Diagnosis not present

## 2018-09-30 DIAGNOSIS — M67371 Transient synovitis, right ankle and foot: Secondary | ICD-10-CM | POA: Diagnosis not present

## 2018-10-08 DIAGNOSIS — B373 Candidiasis of vulva and vagina: Secondary | ICD-10-CM | POA: Diagnosis not present

## 2018-10-08 DIAGNOSIS — Z01419 Encounter for gynecological examination (general) (routine) without abnormal findings: Secondary | ICD-10-CM | POA: Diagnosis not present

## 2018-10-08 DIAGNOSIS — Z3046 Encounter for surveillance of implantable subdermal contraceptive: Secondary | ICD-10-CM | POA: Diagnosis not present

## 2018-10-08 DIAGNOSIS — N760 Acute vaginitis: Secondary | ICD-10-CM | POA: Diagnosis not present

## 2018-10-08 DIAGNOSIS — R87610 Atypical squamous cells of undetermined significance on cytologic smear of cervix (ASC-US): Secondary | ICD-10-CM | POA: Diagnosis not present

## 2018-10-21 DIAGNOSIS — N870 Mild cervical dysplasia: Secondary | ICD-10-CM | POA: Diagnosis not present

## 2018-10-21 DIAGNOSIS — R87610 Atypical squamous cells of undetermined significance on cytologic smear of cervix (ASC-US): Secondary | ICD-10-CM | POA: Diagnosis not present

## 2019-02-11 DIAGNOSIS — Z3046 Encounter for surveillance of implantable subdermal contraceptive: Secondary | ICD-10-CM | POA: Diagnosis not present

## 2019-02-11 DIAGNOSIS — Z113 Encounter for screening for infections with a predominantly sexual mode of transmission: Secondary | ICD-10-CM | POA: Diagnosis not present

## 2019-02-11 DIAGNOSIS — N760 Acute vaginitis: Secondary | ICD-10-CM | POA: Diagnosis not present

## 2019-04-21 ENCOUNTER — Telehealth: Payer: BLUE CROSS/BLUE SHIELD | Admitting: Emergency Medicine

## 2019-04-21 ENCOUNTER — Telehealth (INDEPENDENT_AMBULATORY_CARE_PROVIDER_SITE_OTHER): Payer: BC Managed Care – PPO | Admitting: Emergency Medicine

## 2019-04-21 ENCOUNTER — Encounter: Payer: Self-pay | Admitting: Emergency Medicine

## 2019-04-21 ENCOUNTER — Other Ambulatory Visit: Payer: Self-pay

## 2019-04-21 DIAGNOSIS — R05 Cough: Secondary | ICD-10-CM | POA: Diagnosis not present

## 2019-04-21 DIAGNOSIS — Z114 Encounter for screening for human immunodeficiency virus [HIV]: Secondary | ICD-10-CM | POA: Diagnosis not present

## 2019-04-21 DIAGNOSIS — B373 Candidiasis of vulva and vagina: Secondary | ICD-10-CM | POA: Diagnosis not present

## 2019-04-21 DIAGNOSIS — Z113 Encounter for screening for infections with a predominantly sexual mode of transmission: Secondary | ICD-10-CM | POA: Diagnosis not present

## 2019-04-21 DIAGNOSIS — R252 Cramp and spasm: Secondary | ICD-10-CM

## 2019-04-21 DIAGNOSIS — N760 Acute vaginitis: Secondary | ICD-10-CM | POA: Diagnosis not present

## 2019-04-21 DIAGNOSIS — Z3202 Encounter for pregnancy test, result negative: Secondary | ICD-10-CM | POA: Diagnosis not present

## 2019-04-21 DIAGNOSIS — R053 Chronic cough: Secondary | ICD-10-CM

## 2019-04-21 NOTE — Progress Notes (Signed)
CC: Called and spoke with patient and she states that she is getting tested for COVID tomorrow at a different facility. She has a cough that has been going on for a couple of months. She states the cough can be severe at times causing her vomit. Patient would also like to discuss low potassium, she said that she has been have bad cramps all over her body.

## 2019-04-21 NOTE — Progress Notes (Unsigned)
Called and spoke with patient and she states she was evaluated for COVID by another facility and would not like to keep this appointment.

## 2019-04-21 NOTE — Progress Notes (Signed)
Telemedicine Encounter- SOAP NOTE Established Patient  This telephone encounter was conducted with the patient's (or proxy's) verbal consent via audio telecommunications: yes/no: Yes Patient was instructed to have this encounter in a suitably private space; and to only have persons present to whom they give permission to participate. In addition, patient identity was confirmed by use of name plus two identifiers (DOB and address).  I discussed the limitations, risks, security and privacy concerns of performing an evaluation and management service by telephone and the availability of in person appointments. I also discussed with the patient that there may be a patient responsible charge related to this service. The patient expressed understanding and agreed to proceed.  I spent a total of TIME; 0 MIN TO 60 MIN: 15 minutes talking with the patient or their proxy.  No chief complaint on file. Diffuse cramping  Subjective   Layia Rausch is a 28 y.o. female established patient. Telephone visit today for diffuse cramping that has been going on intermittently for the past few years.  Also has a chronic cough of 9 to 10 months duration.  Will be tested for COVID infection tomorrow. No significant past medical history.  On no chronic medications.  Denies any other significant symptoms.  HPI   Patient Active Problem List   Diagnosis Date Noted  . Seborrheic dermatitis of scalp 11/19/2016    Past Medical History:  Diagnosis Date  . Allergy     Current Outpatient Medications  Medication Sig Dispense Refill  . Etonogestrel (NEXPLANON [place]) Inject into the skin.    . hydrocortisone 2.5 % cream Apply topically 2 (two) times daily. 30 g 0  . triamcinolone ointment (KENALOG) 0.5 % Apply 1 application topically 2 (two) times daily. 30 g 1   No current facility-administered medications for this visit.     No Known Allergies  Social History   Socioeconomic History  . Marital status: Single    Spouse name: Not on file  . Number of children: Not on file  . Years of education: Not on file  . Highest education level: Not on file  Occupational History  . Not on file  Social Needs  . Financial resource strain: Not on file  . Food insecurity    Worry: Not on file    Inability: Not on file  . Transportation needs    Medical: Not on file    Non-medical: Not on file  Tobacco Use  . Smoking status: Former Smoker    Types: Cigarettes    Quit date: 08/04/2015    Years since quitting: 3.7  . Smokeless tobacco: Never Used  Substance and Sexual Activity  . Alcohol use: No    Alcohol/week: 0.0 standard drinks  . Drug use: No  . Sexual activity: Yes    Birth control/protection: Pill  Lifestyle  . Physical activity    Days per week: Not on file    Minutes per session: Not on file  . Stress: Not on file  Relationships  . Social Herbalist on phone: Not on file    Gets together: Not on file    Attends religious service: Not on file    Active member of club or organization: Not on file    Attends meetings of clubs or organizations: Not on file    Relationship status: Not on file  . Intimate partner violence    Fear of current or ex partner: Not on file    Emotionally abused: Not  on file    Physically abused: Not on file    Forced sexual activity: Not on file  Other Topics Concern  . Not on file  Social History Narrative  . Not on file    Review of Systems  Constitutional: Negative.  Negative for chills, fever and weight loss.  HENT: Negative.  Negative for congestion, sinus pain and sore throat.   Eyes: Negative.   Respiratory: Positive for cough (Chronic). Negative for hemoptysis, sputum production, shortness of breath and wheezing.   Cardiovascular: Negative.  Negative for chest pain and palpitations.  Gastrointestinal: Negative for abdominal pain, blood in stool, diarrhea, melena, nausea and vomiting.  Genitourinary: Negative.  Negative for dysuria.   Musculoskeletal:       Diffuse intermittent cramping in chest abdomen and extremities  Skin: Negative.  Negative for rash.  Neurological: Negative for dizziness and headaches.  Endo/Heme/Allergies: Negative.   All other systems reviewed and are negative.   Objective  Alert and oriented x3 in no apparent respiratory distress. Vitals as reported by the patient: There were no vitals filed for this visit.  There are no diagnoses linked to this encounter. Diagnoses and all orders for this visit:  Chronic cough  Muscle cramping Comments: Diffuse   Clinically stable.  No medical concerns identified during this visit. Advised patient she needs an office visit for a complete physical examination with blood work when her COVID status is clear.  I discussed the assessment and treatment plan with the patient. The patient was provided an opportunity to ask questions and all were answered. The patient agreed with the plan and demonstrated an understanding of the instructions.   The patient was advised to call back or seek an in-person evaluation if the symptoms worsen or if the condition fails to improve as anticipated.  I provided 15 minutes of non-face-to-face time during this encounter.  Georgina QuintMiguel Jose Little Winton, MD  Primary Care at [facility]

## 2019-04-30 ENCOUNTER — Ambulatory Visit: Payer: BC Managed Care – PPO | Admitting: Family Medicine

## 2019-04-30 ENCOUNTER — Encounter: Payer: Self-pay | Admitting: Family Medicine

## 2019-04-30 ENCOUNTER — Other Ambulatory Visit: Payer: Self-pay

## 2019-04-30 VITALS — BP 135/86 | HR 72 | Temp 99.2°F | Resp 18 | Ht 63.0 in | Wt 175.0 lb

## 2019-04-30 DIAGNOSIS — R5383 Other fatigue: Secondary | ICD-10-CM | POA: Diagnosis not present

## 2019-04-30 DIAGNOSIS — R252 Cramp and spasm: Secondary | ICD-10-CM

## 2019-04-30 DIAGNOSIS — A5600 Chlamydial infection of lower genitourinary tract, unspecified: Secondary | ICD-10-CM | POA: Diagnosis not present

## 2019-04-30 DIAGNOSIS — K5900 Constipation, unspecified: Secondary | ICD-10-CM | POA: Diagnosis not present

## 2019-04-30 DIAGNOSIS — R140 Abdominal distension (gaseous): Secondary | ICD-10-CM | POA: Diagnosis not present

## 2019-04-30 NOTE — Patient Instructions (Signed)
Your examination appears normal with the exception of being overweight.  Late last year did you did gain a lot of weight which you have maintained, and I would encourage you to work hard on eating less and exercising more for the purpose of losing weight.  Avoid liquid calories, and water is your best beverage.  Try to decrease the quantity of food at meals.  Avoid fried and fatty foods and excessive carbohydrates (breads, pastas, potatoes, rice, etc.)  We are checking general labs on you and I will let you know the results of those when you get the reports.  You have already been tested COVID, and I do not feel like your symptoms justify need for retesting of that today.  Many things cause fatigue.  Assuming that your laboratory data comes back okay, and we do not find anything like diabetes or thyroid disorder, most often fatigue is from a combination of deconditioning (the body being out of shape from not getting enough exercise ) and/or emotional fatigue from being a little bit down emotionally.  I would encourage you to start getting regular exercise.  Walking is free and readily available.  Doing some calisthenics or weights or other upper body exercises can be good for you also.  If more specific symptoms develop or you are doing worse get reexamined.

## 2019-04-30 NOTE — Progress Notes (Addendum)
Patient ID: Danielle Baldwin, female    DOB: [birth year]  Age: 28 y.o. MRN: [id number]  Chief Complaint  Patient presents with  . wants labs    Subjective:   Patient is here wanting lab tests such as a potassium done because she is fatigued.  She says a greater than a year she has had fatigue and some cough.  She has worked as a Haematologist.  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  She is not married.  She does not get regular exercise.  Last year she gained a moderate amount of weight and has not gotten much of it off.  She smokes hookah.  She denies drug use other than occasionally having smoked marijuana.  She has an Implanon so what cycles she has are irregular.  She has occasional migraine but no other major headaches.  No dizziness.  No upper respiratory symptoms.  She just coughs occasionally to clear her throat.  No chest pains or palpitation.  She gets feeling bloated sometimes when she sits a little bit.  She had a small bowel movement today, and usually has some BMs.  When she was young she did have a a lot of severe constipation.  No urinary symptoms.  She cramps easily in her muscles but no other major muscular symptoms.  She is not on any regular medications.  Current allergies, medications, problem list, past/family and social histories reviewed.  Objective:  BP 135/86 (BP Location: Right Arm, Patient Position: Sitting, Cuff Size: Normal)   Pulse 72   Temp 99.2 F (37.3 C) (Oral)   Resp 18   Ht 5\' 3"  (1.6 m)   Wt 175 lb (79.4 kg)   SpO2 100%   BMI 31.00 kg/m   Vital signs are stable.  TMs normal.  Throat clear.  Neck supple without nodes or thyromegaly.  No carotid bruits.  Chest is clear to auscultation.  Heart regular without murmurs.  Abdomen soft without masses or specific tenderness though she is a little touchy all over.  Extremities unremarkable.  Skin unremarkable.  Assessment & Plan:   Assessment: 1. Fatigue, unspecified type   2. Constipation, unspecified constipation type   3.  Abdominal bloating   4. Muscle cramping       Plan: Nonspecific symptoms of fatigue and cramping.  See instructions.  Patient is a strong anti-vaxer.  She refuses a flu shot, says she never will have another vaccination, and that if she has children they will not have vaccinations.  She has no good rationale or reason on this, and I told her to think socially about it.  Orders Placed This Encounter  Procedures  . Comprehensive metabolic panel  . CBC  . TSH  . HIV Antibody (routine testing w rflx)    No orders of the defined types were placed in this encounter.        Patient Instructions  Your examination appears normal with the exception of being overweight.  Late last year did you did gain a lot of weight which you have maintained, and I would encourage you to work hard on eating less and exercising more for the purpose of losing weight.  Avoid liquid calories, and water is your best beverage.  Try to decrease the quantity of food at meals.  Avoid fried and fatty foods and excessive carbohydrates (breads, pastas, potatoes, rice, etc.)  We are checking general labs on you and I will let you know the results of those when you get the reports.  You  have already been tested COVID, and I do not feel like your symptoms justify need for retesting of that today.  Many things cause fatigue.  Assuming that your laboratory data comes back okay, and we do not find anything like diabetes or thyroid disorder, most often fatigue is from a combination of deconditioning (the body being out of shape from not getting enough exercise ) and/or emotional fatigue from being a little bit down emotionally.  I would encourage you to start getting regular exercise.  Walking is free and readily available.  Doing some calisthenics or weights or other upper body exercises can be good for you also.  If more specific symptoms develop or you are doing worse get reexamined.    Return if symptoms worsen or  fail to improve.   Janace Hoardavid Hopper, MD 04/30/2019

## 2019-05-01 LAB — COMPREHENSIVE METABOLIC PANEL
ALT: 18 IU/L (ref 0–32)
AST: 16 IU/L (ref 0–40)
Albumin/Globulin Ratio: 1.7 (ref 1.2–2.2)
Albumin: 4.5 g/dL (ref 3.9–5.0)
Alkaline Phosphatase: 75 IU/L (ref 39–117)
BUN/Creatinine Ratio: 11 (ref 9–23)
BUN: 9 mg/dL (ref 6–20)
Bilirubin Total: <0.2 mg/dL (ref 0.0–1.2)
CO2: 20 mmol/L (ref 20–29)
Calcium: 9.4 mg/dL (ref 8.7–10.2)
Chloride: 103 mmol/L (ref 96–106)
Creatinine, Ser: 0.82 mg/dL (ref 0.57–1.00)
GFR calc Af Amer: 115 mL/min/{1.73_m2} (ref >59)
GFR calc non Af Amer: 100 mL/min/{1.73_m2} (ref >59)
Globulin, Total: 2.7 g/dL (ref 1.5–4.5)
Glucose: 97 mg/dL (ref 65–99)
Potassium: 4.1 mmol/L (ref 3.5–5.2)
Sodium: 139 mmol/L (ref 134–144)
Total Protein: 7.2 g/dL (ref 6.0–8.5)

## 2019-05-01 LAB — CBC
Hematocrit: 42.1 % (ref 34.0–46.6)
Hemoglobin: 13.9 g/dL (ref 11.1–15.9)
MCH: 31.8 pg (ref 26.6–33.0)
MCHC: 33 g/dL (ref 31.5–35.7)
MCV: 96 fL (ref 79–97)
Platelets: 239 10*3/uL (ref 150–450)
RBC: 4.37 x10E6/uL (ref 3.77–5.28)
RDW: 12.2 % (ref 11.7–15.4)
WBC: 7.5 10*3/uL (ref 3.4–10.8)

## 2019-05-01 LAB — TSH: TSH: 1.74 u[IU]/mL (ref 0.450–4.500)

## 2019-05-01 LAB — HIV ANTIBODY (ROUTINE TESTING W REFLEX): HIV Screen 4th Generation wRfx: NONREACTIVE

## 2019-06-17 ENCOUNTER — Encounter: Payer: BC Managed Care – PPO | Admitting: Family Medicine

## 2019-06-29 ENCOUNTER — Telehealth: Payer: Self-pay | Admitting: General Practice

## 2019-06-29 NOTE — Telephone Encounter (Signed)
Copied from [place] [id number]. Topic: General - Other >> Jun 29, 2019  2:48 PM Carolyn Stare wrote: Pt is asking if and who can write a letter stating because of her religion, she would like not to have to take the flu shot. She would like a call back about her labs

## 2019-07-14 NOTE — Telephone Encounter (Signed)
Please schedule patient a telel-med visit to have a letter stating she can not participate with getting the flu shot due to her religion. Patient need to be seen. In person or tele-med

## 2019-07-15 NOTE — Telephone Encounter (Signed)
Pt says that she will cb to schedule that appt

## 2020-08-29 ENCOUNTER — Ambulatory Visit: Payer: Self-pay | Admitting: Emergency Medicine

## 2020-08-29 ENCOUNTER — Other Ambulatory Visit: Payer: Self-pay

## 2020-09-03 NOTE — L&D Delivery Note (Signed)
Delivery Note After a 1.5 hour 2nd stage, at 2:25 AM a viable female was delivered via Vaginal, Spontaneous (Presentation: Right Occiput Posterior).  APGAR: 8, 9; weight pending. The placenta separated spontaneously and delivered via CCT and maternal pushing effort.  It was inspected and appears to be intact with a 3 VC. Pt declined pitocin.  Anesthesia: None Episiotomy: None Lacerations: 1st degree;Perineal, hemostatic Suture Repair:  Est. Blood Loss (mL): 189  Mom to postpartum.  Baby to Couplet care / Skin to Skin.  Danielle Baldwin 06/09/2021, 2:43 AM

## 2020-10-26 ENCOUNTER — Telehealth: Payer: Self-pay | Admitting: Family Medicine

## 2020-10-26 NOTE — Telephone Encounter (Signed)
Patient is needing a copy of labs from visit with Dr.Hopper  04/25/2019 mailed to her .. verified correct address on file    Please advise

## 2020-10-27 NOTE — Telephone Encounter (Signed)
sent 

## 2020-11-21 ENCOUNTER — Encounter: Payer: Self-pay | Admitting: *Deleted

## 2020-11-29 ENCOUNTER — Other Ambulatory Visit: Payer: Self-pay

## 2020-11-29 ENCOUNTER — Ambulatory Visit (INDEPENDENT_AMBULATORY_CARE_PROVIDER_SITE_OTHER): Payer: Medicaid Other | Admitting: *Deleted

## 2020-11-29 VITALS — BP 119/68 | HR 80 | Temp 98.2°F | Wt 167.6 lb

## 2020-11-29 DIAGNOSIS — Z3400 Encounter for supervision of normal first pregnancy, unspecified trimester: Secondary | ICD-10-CM | POA: Insufficient documentation

## 2020-11-29 DIAGNOSIS — O368390 Maternal care for abnormalities of the fetal heart rate or rhythm, unspecified trimester, not applicable or unspecified: Secondary | ICD-10-CM

## 2020-11-29 NOTE — Progress Notes (Signed)
   Location: [facility] Patient: clinic Provider: clinic  PRENATAL INTAKE SUMMARY  Danielle Baldwin presents today New OB Nurse Interview.  OB History    Gravida  1   Para      Term      Preterm      AB      Living        SAB      IAB      Ectopic      Multiple      Live Births             I have reviewed the patient's medical, obstetrical, social, and family histories, medications, and available lab results.  SUBJECTIVE She has no unusual complaints  OBJECTIVE Initial Nurse interview for history/labs (New OB)  EDD: 06/18/21 by LMP GA: [redacted]w[redacted]d G1P0 FHT: unable to hear fetal tones  GENERAL APPEARANCE: alert, well appearing, in no apparent distress, oriented to person, place and time   ASSESSMENT Normal pregnancy  PLAN Prenatal care:  [facility] Pnl/HIV/Hep C OB Urine Culture GC/CT/PAP to be completed at next visit with Raelyn Mora, CNM 12/08/20 HgbEval/SMA/CF ([facility]) Panorama A1C Patient to sign up for Babyscripts Continue PNV Has BP monitor/weight scale at home ultrsound <14 weeks to confirm viability and fetal tones   Follow Up Instructions:   I discussed the assessment and treatment plan with the patient. The patient was provided an opportunity to ask questions and all were answered. The patient agreed with the plan and demonstrated an understanding of the instructions.   The patient was advised to call back or seek an in-person evaluation if the symptoms worsen or if the condition fails to improve as anticipated.  I provided 40 minutes of  face-to-face time during this encounter.  Clovis Pu, RN

## 2020-11-29 NOTE — Patient Instructions (Addendum)
Genetic Screening Results Information: You are having genetic testing called Panorama today.  It will take approximately 2 weeks before the results are available.  To get your results, you need Internet access to a web browser to search [facility]/MyChart (the direct app on your phone will not give you these results).  Then select Lab Scanned and click on the blue hyper link that says View Image to see your Panorama results.  You can also use the directions on the purple card given to look up your results directly on the [place] website.   [email or web address]">  First Trimester of Pregnancy  The first trimester of pregnancy starts on the first day of your last menstrual period until the end of week 12. This is also called months 1 through 3 of pregnancy. Body changes during your first trimester Your body goes through many changes during pregnancy. The changes usually return to normal after your baby is born. Physical changes  You may gain or lose weight.  Your breasts may grow larger and hurt. The area around your nipples may get darker.  Dark spots or blotches may develop on your face.  You may have changes in your hair. Health changes  You may feel like you might vomit (nauseous), and you may vomit.  You may have heartburn.  You may have headaches.  You may have trouble pooping (constipation).  Your gums may bleed. Other changes  You may get tired easily.  You may pee (urinate) more often.  Your menstrual periods will stop.  You may not feel hungry.  You may want to eat certain kinds of food.  You may have changes in your emotions from day to day.  You may have more dreams. Follow these instructions at home: Medicines  Take over-the-counter and prescription medicines only as told by your doctor. Some medicines are not safe during pregnancy.  Take a prenatal vitamin that contains at least 600 micrograms (mcg) of folic acid. Eating and  drinking  Eat healthy meals that include: ? Fresh fruits and vegetables. ? Whole grains. ? Good sources of protein, such as meat, eggs, or tofu. ? Low-fat dairy products.  Avoid raw meat and unpasteurized juice, milk, and cheese.  If you feel like you may vomit, or you vomit: ? Eat 4 or 5 small meals a day instead of 3 large meals. ? Try eating a few soda crackers. ? Drink liquids between meals instead of during meals.  You may need to take these actions to prevent or treat trouble pooping: ? Drink enough fluids to keep your pee (urine) pale yellow. ? Eat foods that are high in fiber. These include beans, whole grains, and fresh fruits and vegetables. ? Limit foods that are high in fat and sugar. These include fried or sweet foods. Activity  Exercise only as told by your doctor. Most people can do their usual exercise routine during pregnancy.  Stop exercising if you have cramps or pain in your lower belly (abdomen) or low back.  Do not exercise if it is too hot or too humid, or if you are in a place of great height (high altitude).  Avoid heavy lifting.  If you choose to, you may have sex unless your doctor tells you not to. Relieving pain and discomfort  Wear a good support bra if your breasts are sore.  Rest with your legs raised (elevated) if you have leg cramps or low back pain.  If you have bulging veins (varicose veins) in your  legs: ? Wear support hose as told by your doctor. ? Raise your feet for 15 minutes, 3-4 times a day. ? Limit salt in your food. Safety  Wear your seat belt at all times when you are in a car.  Talk with your doctor if someone is hurting you or yelling at you.  Talk with your doctor if you are feeling sad or have thoughts of hurting yourself. Lifestyle  Do not use hot tubs, steam rooms, or saunas.  Do not douche. Do not use tampons or scented sanitary pads.  Do not use herbal medicines, illegal drugs, or medicines that are not  approved by your doctor. Do not drink alcohol.  Do not smoke or use any products that contain nicotine or tobacco. If you need help quitting, ask your doctor.  Avoid cat litter boxes and soil that is used by cats. These carry germs that can cause harm to the baby and can cause a loss of your baby by miscarriage or stillbirth. General instructions  Keep all follow-up visits. This is important.  Ask for help if you need counseling or if you need help with nutrition. Your doctor can give you advice or tell you where to go for help.  Visit your dentist. At home, brush your teeth with a soft toothbrush. Floss gently.  Write down your questions. Take them to your prenatal visits. Where to find more information  American Pregnancy Association: americanpregnancy.org  [employer] of Obstetricians and Gynecologists: www.acog.org  Office on Women's Health: KeywordPortfolios.com.br Contact a doctor if:  You are dizzy.  You have a fever.  You have mild cramps or pressure in your lower belly.  You have a nagging pain in your belly area.  You continue to feel like you may vomit, you vomit, or you have watery poop (diarrhea) for 24 hours or longer.  You have a bad-smelling fluid coming from your vagina.  You have pain when you pee.  You are exposed to a disease that spreads from person to person, such as chickenpox, measles, Zika virus, HIV, or hepatitis. Get help right away if:  You have spotting or bleeding from your vagina.  You have very bad belly cramping or pain.  You have shortness of breath or chest pain.  You have any kind of injury, such as from a fall or a car crash.  You have new or increased pain, swelling, or redness in an arm or leg. Summary  The first trimester of pregnancy starts on the first day of your last menstrual period until the end of week 12 (months 1 through 3).  Eat 4 or 5 small meals a day instead of 3 large meals.  Do not smoke or use any  products that contain nicotine or tobacco. If you need help quitting, ask your doctor.  Keep all follow-up visits. This information is not intended to replace advice given to you by your health care provider. Make sure you discuss any questions you have with your health care provider. Document Revised: 01/27/2020 Document Reviewed: 12/03/2019 Elsevier Patient Education  2021 [place].  Warning Signs During Pregnancy During pregnancy, your body goes through many changes. Some changes may be uncomfortable, but most do not represent a serious problem. However, it is important to learn when certain signs and symptoms may indicate a problem. Talk with your health care provider about your current health and any medical conditions you have. Make sure you know the symptoms to watch for and report. How does this  affect me? Warning signs during pregnancy Let your health care provider know if you have any of the following warning signs:  Dizziness or feeling faint.  Nausea, vomiting, or diarrhea that lasts 24 hours or longer.  Spotting or bleeding from your vagina.  Abdominal cramping or pain in your pelvis or lower back.  Shortness of breath, difficulty breathing, or chest pain.  New or increased pain, swelling, or redness in an arm or leg.  Your baby is moving less than usual or is not moving. You should also watch for signs of a serious medical condition called preeclampsia. This may include:  A severe, throbbing headache that does not go away.  Vision changes, such as blurred or double vision, light sensitivity, or seeing spots in front of your eyes.  Sudden or extreme swelling of your face, hands, legs, or feet. Pregnancy causes changes that may make it more likely for you to get an infection. Let your health care provider know if you have signs of infection, such as:  A fever.  A bad-smelling vaginal discharge.  Pain or burning when you urinate. How does this affect my  baby? Throughout your pregnancy, always report any of the warning signs of a problem to your health care provider. This can help prevent complications that may affect your baby, including:  Increased risk for premature birth.  Infection that may be transmitted to your baby.  Increased risk for stillbirth. Follow these instructions at home:  Take over-the-counter and prescription medicines only as told by your health care provider.  Keep all follow-up visits. This is important.   Where to find more information  Office on Women's Health: CelebrityForeclosures.cz  [employer] of Obstetricians and Gynecologists: EmploymentAssurance.cz Contact a health care provider if:  You have any warning signs of problems during your pregnancy.  Any of the following apply to you during your pregnancy: ? You have strong emotions, such as sadness or anxiety, that interfere with work or personal relationships. ? You feel unsafe in your home. ? You are using tobacco products, alcohol, or drugs, and you need help to stop. Get help right away if:  You have signs or symptoms of labor before 37 weeks of pregnancy. These include: ? Contractions that are 5 minutes or less apart, or that increase in frequency, intensity, or length. ? Sudden, sharp abdominal pain or low back pain. ? Uncontrolled gush or trickle of fluid from your vagina. Summary  Always report any warning signs to your health care provider to prevent complications that may affect both you and your baby.  Talk with your health care provider about your current health and any medical conditions you have. Make sure you know the symptoms to watch for and report.  Keep all follow-up visits. This is important. This information is not intended to replace advice given to you by your health care provider. Make sure you discuss any questions you have with your health care provider. Document Revised: 01/27/2020 Document Reviewed:  12/25/2019 Elsevier Patient Education  2021 Elsevier Inc.  Safe Medications in Pregnancy   Acne: Benzoyl Peroxide Salicylic Acid  Backache/Headache: Tylenol: 2 regular strength every 4 hours OR              2 Extra strength every 6 hours  Colds/Coughs/Allergies: Benadryl (alcohol free) 25 mg every 6 hours as needed Breath right strips Claritin Cepacol throat lozenges Chloraseptic throat spray Cold-Eeze- up to three times per day Cough drops, alcohol free Flonase (by prescription only) Guaifenesin Mucinex Robitussin  DM (plain only, alcohol free) Saline nasal spray/drops Sudafed (pseudoephedrine) & Actifed ** use only after [redacted] weeks gestation and if you do not have high blood pressure Tylenol Vicks Vaporub Zinc lozenges Zyrtec   Constipation: Colace Ducolax suppositories Fleet enema Glycerin suppositories Metamucil Milk of magnesia Miralax Senokot Smooth move tea  Diarrhea: Kaopectate Imodium A-D  *NO pepto Bismol  Hemorrhoids: Anusol Anusol HC Preparation H Tucks  Indigestion: Tums Maalox Mylanta Zantac  Pepcid  Insomnia: Benadryl (alcohol free) 25mg  every 6 hours as needed Tylenol PM Unisom, no Gelcaps  Leg Cramps: Tums MagGel  Nausea/Vomiting:  Bonine Dramamine Emetrol Ginger extract Sea bands Meclizine  Nausea medication to take during pregnancy:  Unisom (doxylamine succinate 25 mg tablets) Take one tablet daily at bedtime. If symptoms are not adequately controlled, the dose can be increased to a maximum recommended dose of two tablets daily (1/2 tablet in the morning, 1/2 tablet mid-afternoon and one at bedtime). Vitamin B6 100mg  tablets. Take one tablet twice a day (up to 200 mg per day).  Skin Rashes: Aveeno products Benadryl cream or 25mg  every 6 hours as needed Calamine Lotion 1% cortisone cream  Yeast infection: Gyne-lotrimin 7 Monistat 7   **If taking multiple medications, please check labels to avoid duplicating  the same active ingredients **take medication as directed on the label ** Do not exceed 4000 mg of tylenol in 24 hours **Do not take medications that contain aspirin or ibuprofen   AREA PEDIATRIC/FAMILY PRACTICE PHYSICIANS  [facility] [place] [address] [place], [address] Phone - [phone]   Fax - [phone]  JACK AMOS [address] [place], [phone]  [address] Phone - [phone]   Fax - [phone]  [facility] [address] [place], [facility]  [address] Phone - [phone]   Fax - [phone]  [facility] [address] [place], [facility]  [address] [place] Phone - [phone]   Fax - [phone]  [facility] [address] [place], [facility]  [address] Phone - [phone]   Fax - [phone]  [facility] [phone] [place], [address] Phone - [phone]   Fax - [phone]  [facility] [place] [address] [place], [phone]  [address] Phone - [phone]   Fax - [phone]  [facility] AT [facility] [address] [place], [address] [place], [address]  [place] Phone - [phone]   Fax - [phone]  [facility] AT [facility] [address] [place], [facility]  [address] Phone - [phone]   Fax - [phone] [facility] AT [place] [address] [place], [facility]  [address] Phone - [phone]   Fax - [phone]  [facility] AT [facility] [address] [place], [phone]  [facility] Phone - [phone]   Fax - [phone]  [facility] AT [facility] [address] [place], [facility]  [address] Phone - [phone]   Fax - [phone]  [facility] [place] [address] [place], [phone]  [address] Phone - [phone]   Fax - [phone]  [facility] [address] [place], [facility]   [address] Phone - [phone]  [facility] [address] [place], [facility]  [address] Phone - [phone]   Fax - [phone]  [facility] [address] [place], [facility]  [facility] Phone - [phone]   Fax - [phone]  [facility] [address] [place], [place]  [address] Phone - [phone]   Fax - [phone]  [facility] [address] [place], [place]  [address] Phone - [phone]   Fax - [phone]   [facility] [address] [place] [address] [place], [place]  [address] Phone - [phone]   Fax - [phone]  [facility] [address] [place], [place]  [address] Phone - [phone]   Fax - [phone]  DAVID RUBIN [address] [place], [place]  [address] Phone - [phone]   Fax - [phone]  [facility] [address] [place], [place]  [address] Phone - [phone]   Fax - [phone]  [facility] [address] [place], [place]  [address] Phone - [phone]   Fax - [phone] Gerarda Fraction [address] [place], [place]  [address] Phone - [phone]   Fax - [phone]  [facility] [address] [place], [place]  [address] Phone - [phone]   Fax - [phone]  [facility] MED[facility]INE - [place] [address] [place], [place]  [address] Phone - [phone]   Fax - [phone]

## 2020-11-30 LAB — CBC/D/PLT+RPR+RH+ABO+RUB AB...
Antibody Screen: NEGATIVE
Basophils Absolute: 0 10*3/uL (ref 0.0–0.2)
Basos: 1 %
EOS (ABSOLUTE): 0.2 10*3/uL (ref 0.0–0.4)
Eos: 3 %
HCV Ab: <0.1 s/co ratio (ref 0.0–0.9)
HIV Screen 4th Generation wRfx: NONREACTIVE
Hematocrit: 36.2 % (ref 34.0–46.6)
Hemoglobin: 12.2 g/dL (ref 11.1–15.9)
Hepatitis B Surface Ag: NEGATIVE
Immature Grans (Abs): 0 10*3/uL (ref 0.0–0.1)
Immature Granulocytes: 0 %
Lymphocytes Absolute: 1.9 10*3/uL (ref 0.7–3.1)
Lymphs: 30 %
MCH: 31.2 pg (ref 26.6–33.0)
MCHC: 33.7 g/dL (ref 31.5–35.7)
MCV: 93 fL (ref 79–97)
Monocytes Absolute: 0.4 10*3/uL (ref 0.1–0.9)
Monocytes: 7 %
Neutrophils Absolute: 3.8 10*3/uL (ref 1.4–7.0)
Neutrophils: 59 %
Platelets: 244 10*3/uL (ref 150–450)
RBC: 3.91 x10E6/uL (ref 3.77–5.28)
RDW: 12.8 % (ref 11.7–15.4)
RPR Ser Ql: NONREACTIVE
Rh Factor: POSITIVE
Rubella Antibodies, IGG: 1.06 index (ref >0.99)
WBC: 6.3 10*3/uL (ref 3.4–10.8)

## 2020-11-30 LAB — HEMOGLOBIN A1C
Est. average glucose Bld gHb Est-mCnc: 114 mg/dL
Hgb A1c MFr Bld: 5.6 % (ref 4.8–5.6)

## 2020-11-30 LAB — HCV INTERPRETATION

## 2020-12-01 LAB — CULTURE, OB URINE

## 2020-12-01 LAB — URINE CULTURE, OB REFLEX

## 2020-12-08 ENCOUNTER — Ambulatory Visit
Admission: RE | Admit: 2020-12-08 | Discharge: 2020-12-08 | Disposition: A | Discharge status: Home / Self Care | Payer: Medicaid Other | Source: Ambulatory Visit | Attending: Obstetrics and Gynecology | Admitting: Obstetrics and Gynecology

## 2020-12-08 ENCOUNTER — Other Ambulatory Visit: Payer: Self-pay

## 2020-12-08 ENCOUNTER — Encounter: Payer: Self-pay | Admitting: Obstetrics and Gynecology

## 2020-12-08 ENCOUNTER — Ambulatory Visit (INDEPENDENT_AMBULATORY_CARE_PROVIDER_SITE_OTHER): Payer: Medicaid Other | Admitting: Obstetrics and Gynecology

## 2020-12-08 ENCOUNTER — Other Ambulatory Visit (HOSPITAL_COMMUNITY)
Admission: RE | Admit: 2020-12-08 | Discharge: 2020-12-08 | Disposition: A | Discharge status: Home / Self Care | Payer: Medicaid Other | Source: Ambulatory Visit | Attending: Obstetrics and Gynecology | Admitting: Obstetrics and Gynecology

## 2020-12-08 VITALS — BP 119/78 | HR 96 | Wt 170.4 lb

## 2020-12-08 DIAGNOSIS — Z124 Encounter for screening for malignant neoplasm of cervix: Secondary | ICD-10-CM

## 2020-12-08 DIAGNOSIS — O368390 Maternal care for abnormalities of the fetal heart rate or rhythm, unspecified trimester, not applicable or unspecified: Secondary | ICD-10-CM | POA: Diagnosis present

## 2020-12-08 DIAGNOSIS — Z3A12 12 weeks gestation of pregnancy: Secondary | ICD-10-CM | POA: Insufficient documentation

## 2020-12-08 DIAGNOSIS — Z3400 Encounter for supervision of normal first pregnancy, unspecified trimester: Secondary | ICD-10-CM | POA: Insufficient documentation

## 2020-12-08 NOTE — Patient Instructions (Addendum)
Patient Experience [phone] for formal complaint

## 2020-12-08 NOTE — Progress Notes (Signed)
INITIAL OBSTETRICAL VISIT Patient name: Danielle Baldwin MRN [id number]  Date of birth: 1991-08-17 Chief Complaint:   Initial Prenatal Visit  History of Present Illness:   Danielle Baldwin is a 30 y.o. G1P0 African American female at [redacted]w[redacted]d by LMP and U/S with an Estimated Date of Delivery: 06/18/21 being seen today for her initial obstetrical visit.  Her obstetrical history is significant for none. This is a planned pregnancy. She and the father of the baby (FOB) "Fayrene Fearing" live together. She has a support system that consists of the FOB/family/friends. Today she reports no complaints.   Patient's last menstrual period was 09/11/2020 (exact date). Last pap 2013 - patient reports she "always has abnormal paps and usually gets them done at the [facility], but did not go in February." Results were: abnormal  per patient Review of Systems:   Pertinent items are noted in HPI Denies cramping/contractions, leakage of fluid, vaginal bleeding, abnormal vaginal discharge w/ itching/odor/irritation, headaches, visual changes, shortness of breath, chest pain, abdominal pain, severe nausea/vomiting, or problems with urination or bowel movements unless otherwise stated above.  Pertinent History Reviewed:  Reviewed past medical,surgical, social, obstetrical and family history.  Reviewed problem list, medications and allergies. OB History  Gravida Para Term Preterm AB Living  1            SAB IAB Ectopic Multiple Live Births               # Outcome Date GA Lbr Len/2nd Weight Sex Delivery Anes PTL Lv  1 Current            Physical Assessment:   Vitals:   12/08/20 1607  BP: 119/78  Pulse: 96  Weight: 170 lb 6.4 oz (77.3 kg)  Body mass index is 30.19 kg/m.       Physical Examination:  General appearance - well appearing, and in no distress  Mental status - alert, oriented to person, place, and time  Psych:  She has a normal mood and affect  Skin - warm and dry, normal color, no suspicious lesions noted  Chest -  effort normal, all lung fields clear to auscultation bilaterally  Heart - normal rate and regular rhythm  Abdomen - soft, nontender  Extremities:  No swelling or varicosities noted  Pelvic - VULVA: normal appearing vulva with no masses, tenderness or lesions  VAGINA: normal appearing vagina with normal color and discharge, no lesions.   CERVIX: normal appearing cervix without discharge or lesions, no CMT  Thin prep pap is done with reflex HR HPV cotesting   FHTs by doppler: 156 bpm  Assessment & Plan:  1) Low-Risk Pregnancy G1P0 at [redacted]w[redacted]d with an Estimated Date of Delivery: 06/18/21   2) Initial OB visit - Welcomed to practice and introduced self to patient in addition to discussing other advanced practice providers that she may be seeing at this practice - Congratulated patient - Anticipatory guidance on upcoming appointments - Educated on COVID19 and pregnancy and the integration of virtual appointments  - Educated on babyscripts app- patient reports she has not received email, encouraged to look in spam folder and to call office if she still has not received email - patient verbalizes understanding    3) Supervision of normal first pregnancy, antepartum - Cytology - PAP( [place]) - Cervicovaginal ancillary only( [place])  4) [redacted] weeks gestation of pregnancy - Cytology - PAP( [place]) - Cervicovaginal ancillary only( [place])  5) Pap smear for cervical cancer screening - Cytology -  PAP( [place])    Meds: No orders of the defined types were placed in this encounter.   Initial labs obtained Continue prenatal vitamins Reviewed n/v relief measures and warning s/s to report Reviewed recommended weight gain based on pre-gravid BMI Encouraged well-balanced diet Genetic Screening discussed: results reviewed Cystic fibrosis, SMA, Fragile X screening discussed results reviewed The nature of [facility] with multiple MDs and other  Advanced Practice Providers was explained to patient; also emphasized that residents, students are part of our team.  Discussed optimized OB schedule and video visits. Advised can have an in-office visit whenever she feels she needs to be seen.  Does own BP cuff. Check BP weekly, let us know if >140/90. Advised to call during normal business hours and there is an after-hours nurse line available.    Follow-up: Return in about 4 weeks (around 01/05/2021) for Return OB visit.   No orders of the defined types were placed in this encounter.   Raelyn Mora MSN, CNM 12/08/2020

## 2020-12-09 ENCOUNTER — Telehealth: Payer: Self-pay | Admitting: Medical

## 2020-12-09 ENCOUNTER — Other Ambulatory Visit: Payer: Self-pay | Admitting: Obstetrics and Gynecology

## 2020-12-09 DIAGNOSIS — B9689 Other specified bacterial agents as the cause of diseases classified elsewhere: Secondary | ICD-10-CM

## 2020-12-09 DIAGNOSIS — N760 Acute vaginitis: Secondary | ICD-10-CM

## 2020-12-09 LAB — CERVICOVAGINAL ANCILLARY ONLY
Bacterial Vaginitis (gardnerella): POSITIVE — AB
Candida Glabrata: NEGATIVE
Candida Vaginitis: NEGATIVE
Chlamydia: NEGATIVE
Comment: NEGATIVE
Comment: NEGATIVE
Comment: NEGATIVE
Comment: NEGATIVE
Comment: NEGATIVE
Comment: NORMAL
Neisseria Gonorrhea: NEGATIVE
Trichomonas: NEGATIVE

## 2020-12-09 LAB — CYTOLOGY - PAP: Diagnosis: NEGATIVE

## 2020-12-09 MED ORDER — METRONIDAZOLE 500 MG PO TABS: 500.0000 mg | ORAL_TABLET | Freq: Two times a day (BID) | ORAL | 0 refills | Status: DC

## 2020-12-09 NOTE — Telephone Encounter (Signed)
I called Danielle Baldwin today at 11:43 AM and confirmed patient's identity using two patient identifiers. [place] results from yesterday were reviewed. Patient is scheduled for her next OB visit at [facility] on 01/05/21. First trimester warning signs reviewed. Patient voiced understanding and had no further questions.   US OB LESS THAN 14 WEEKS WITH OB TRANSVAGINAL  Result Date: 12/08/2020 CLINICAL DATA:  Initial evaluation for early pregnancy, evaluate viability, unable to hear fetal heart tones. EXAM: OBSTETRIC <14 WK [place] AND TRANSVAGINAL OB US TECHNIQUE: Both transabdominal and transvaginal ultrasound examinations were performed for complete evaluation of the gestation as well as the maternal uterus, adnexal regions, and pelvic cul-de-sac. Transvaginal technique was performed to assess early pregnancy. COMPARISON:  None. FINDINGS: Intrauterine gestational sac: Single Yolk sac:  Negative. Embryo:  Present. Cardiac Activity: Present. Heart Rate: 154 bpm CRL: 63.3 mm   12 w   5 d                  [place] EDC: 06/17/2021 Subchorionic hemorrhage:  None visualized. Maternal uterus/adnexae: Ovaries are normal bilaterally. No adnexal mass. Trace free fluid within the pelvis. IMPRESSION: 1. Single viable intrauterine pregnancy as above, estimated gestational age [redacted] weeks and 5 days by crown-rump length, with ultrasound EDC of 06/17/2021. No complication. 2. No other acute maternal uterine or adnexal abnormality. Electronically Signed   By: Rise Mu M.D.   On: 12/08/2020 14:15    Marny Lowenstein, PA-C 12/09/2020 11:43 AM

## 2020-12-13 ENCOUNTER — Encounter: Payer: Self-pay | Admitting: Obstetrics and Gynecology

## 2020-12-14 ENCOUNTER — Ambulatory Visit: Payer: Self-pay

## 2020-12-14 NOTE — Telephone Encounter (Signed)
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 She says they told them if they are pregnant not to take the class due to the loud noises and possible lead poisoning. She says since she's [redacted] weeks pregnant, she wanted to find out from her doctor if it was ok for her to take the class. I advised to call OB office. She says she called and received a message from [place], so that's why she's calling back. I advised I will send this to her for recommendation. Patient says the class is tomorrow at 8am in [place].

## 2020-12-14 NOTE — Telephone Encounter (Signed)
Patient called, left VM to return the call to [phone] to speak to a nurse.      Pt called and is requesting to have a nurse give her a call back. She states that she is needing to be re certified through the gun range for her job, but she is [redacted] weeks pregnant. She states that she is not sure if she is able to handle a gun while being pregnant. Advised pt to contact OBGYN and pt refused. Please advise.

## 2020-12-15 NOTE — Telephone Encounter (Signed)
Please call this patient.

## 2020-12-15 NOTE — Telephone Encounter (Signed)
Opened in error

## 2021-01-05 ENCOUNTER — Other Ambulatory Visit: Payer: Self-pay

## 2021-01-05 ENCOUNTER — Ambulatory Visit (INDEPENDENT_AMBULATORY_CARE_PROVIDER_SITE_OTHER): Payer: Medicaid Other | Admitting: Obstetrics and Gynecology

## 2021-01-05 VITALS — BP 130/78 | HR 88 | Temp 98.0°F | Wt 172.2 lb

## 2021-01-05 DIAGNOSIS — Z3A16 16 weeks gestation of pregnancy: Secondary | ICD-10-CM

## 2021-01-05 DIAGNOSIS — Z3400 Encounter for supervision of normal first pregnancy, unspecified trimester: Secondary | ICD-10-CM

## 2021-01-05 NOTE — Progress Notes (Signed)
   LOW-RISK PREGNANCY OFFICE VISIT Patient name: Danielle Baldwin MRN [id number]  Date of birth: Jan 30, 1991 Chief Complaint:   Routine Prenatal Visit  History of Present Illness:   Danielle Baldwin is a 30 y.o. G1P0 female at [redacted]w[redacted]d with an Estimated Date of Delivery: 06/18/21 being seen today for ongoing management of a low-risk pregnancy.  Today she reports no complaints. Contractions: Not present. Vag. Bleeding: None.  Movement: Present. denies leaking of fluid. Review of Systems:   Pertinent items are noted in HPI Denies abnormal vaginal discharge w/ itching/odor/irritation, headaches, visual changes, shortness of breath, chest pain, abdominal pain, severe nausea/vomiting, or problems with urination or bowel movements unless otherwise stated above. Pertinent History Reviewed:  Reviewed past medical,surgical, social, obstetrical and family history.  Reviewed problem list, medications and allergies. Physical Assessment:   Vitals:   01/05/21 1016  BP: 130/78  Pulse: 88  Temp: 98 F (36.7 C)  Weight: 172 lb 3.2 oz (78.1 kg)  Body mass index is 30.5 kg/m.        Physical Examination:   General appearance: Well appearing, and in no distress  Mental status: Alert, oriented to person, place, and time  Skin: Warm & dry  Cardiovascular: Normal heart rate noted  Respiratory: Normal respiratory effort, no distress  Abdomen: Soft, gravid, nontender  Pelvic: Cervical exam deferred         Extremities: Edema: None  Fetal Status: Fetal Heart Rate (bpm): 156   Movement: Present    No results found for this or any previous visit (from the past 24 hour(s)).  Assessment & Plan:  1) Low-risk pregnancy G1P0 at [redacted]w[redacted]d with an Estimated Date of Delivery: 06/18/21   2) Supervision of normal first pregnancy, antepartum  - AFP, Serum, Open Spina Bifida,  - [place] MFM OB DETAIL +14 WK  3) [redacted] weeks gestation of pregnancy    Meds: No orders of the defined types were placed in this encounter.  Labs/procedures  today: AFP  Plan:  Continue routine obstetrical care   Reviewed: Preterm labor symptoms and general obstetric precautions including but not limited to vaginal bleeding, contractions, leaking of fluid and fetal movement were reviewed in detail with the patient.  All questions were answered. Has home bp cuff. Check bp weekly, let us know if >140/90.   Follow-up: Return in about 4 weeks (around 02/02/2021) for Return OB - My Chart video.  Orders Placed This Encounter  Procedures  . [place] MFM OB DETAIL +14 WK  . AFP, Serum, Open Spina Bifida   Raelyn Mora MSN, CNM 01/05/2021 10:51 AM

## 2021-01-07 LAB — AFP, SERUM, OPEN SPINA BIFIDA
AFP MoM: 1.63
AFP Value: 57.3 ng/mL
Gest. Age on Collection Date: 16 weeks
Maternal Age At EDD: 28.1 yr
OSBR Risk 1 IN: 3889
Test Results:: NEGATIVE
Weight: 172 [lb_av]

## 2021-01-27 ENCOUNTER — Other Ambulatory Visit: Payer: Self-pay

## 2021-01-27 ENCOUNTER — Ambulatory Visit: Payer: Medicaid Other | Admitting: *Deleted

## 2021-01-27 ENCOUNTER — Ambulatory Visit: Payer: Medicaid Other | Attending: Obstetrics and Gynecology

## 2021-01-27 DIAGNOSIS — Z3400 Encounter for supervision of normal first pregnancy, unspecified trimester: Secondary | ICD-10-CM | POA: Diagnosis not present

## 2021-02-02 ENCOUNTER — Telehealth (INDEPENDENT_AMBULATORY_CARE_PROVIDER_SITE_OTHER): Payer: Medicaid Other | Admitting: Obstetrics and Gynecology

## 2021-02-02 ENCOUNTER — Other Ambulatory Visit (HOSPITAL_COMMUNITY)
Admission: RE | Admit: 2021-02-02 | Discharge: 2021-02-02 | Disposition: A | Discharge status: Home / Self Care | Payer: Medicaid Other | Source: Ambulatory Visit | Attending: Obstetrics and Gynecology | Admitting: Obstetrics and Gynecology

## 2021-02-02 ENCOUNTER — Encounter: Payer: Self-pay | Admitting: Obstetrics and Gynecology

## 2021-02-02 ENCOUNTER — Other Ambulatory Visit: Payer: Self-pay

## 2021-02-02 VITALS — BP 105/62 | HR 80 | Temp 98.0°F | Wt 172.0 lb

## 2021-02-02 DIAGNOSIS — Z3400 Encounter for supervision of normal first pregnancy, unspecified trimester: Secondary | ICD-10-CM | POA: Diagnosis not present

## 2021-02-02 DIAGNOSIS — O26892 Other specified pregnancy related conditions, second trimester: Secondary | ICD-10-CM

## 2021-02-02 DIAGNOSIS — Z3A20 20 weeks gestation of pregnancy: Secondary | ICD-10-CM | POA: Insufficient documentation

## 2021-02-02 DIAGNOSIS — O26899 Other specified pregnancy related conditions, unspecified trimester: Secondary | ICD-10-CM

## 2021-02-02 DIAGNOSIS — N898 Other specified noninflammatory disorders of vagina: Secondary | ICD-10-CM

## 2021-02-02 DIAGNOSIS — R300 Dysuria: Secondary | ICD-10-CM

## 2021-02-02 LAB — POCT URINALYSIS DIPSTICK OB
Bilirubin, UA: NEGATIVE
Blood, UA: NEGATIVE
Glucose, UA: NEGATIVE
Ketones, UA: NEGATIVE
Leukocytes, UA: NEGATIVE
Nitrite, UA: NEGATIVE
Spec Grav, UA: 1.02 (ref 1.010–1.025)
Urobilinogen, UA: 0.2 E.U./dL
pH, UA: 7.5 (ref 5.0–8.0)

## 2021-02-02 MED ORDER — BLOOD PRESSURE MONITOR AUTOMAT DEVI: 1.0000 | Freq: Every day | 0 refills | Status: AC

## 2021-02-02 MED ORDER — GOJJI WEIGHT SCALE MISC: 1.0000 | Freq: Every day | 0 refills | Status: DC | PRN

## 2021-02-02 NOTE — Progress Notes (Signed)
MY CHART VIDEO VIRTUAL OBSTETRICS VISIT ENCOUNTER NOTE  I connected with Danielle Baldwin on 02/02/21 at 11:10 AM EDT by My Chart video at home and verified that I am speaking with the correct person using two identifiers. Provider located at [employer] for [employer] at [place].   I discussed the limitations, risks, security and privacy concerns of performing an evaluation and management service by My Chart video and the availability of in person appointments. I also discussed with the patient that there may be a patient responsible charge related to this service. The patient expressed understanding and agreed to proceed.  I discussed the limitations of telemedicine and the availability of in person appointments.  Discussed there is a possibility of technology failure and discussed alternative modes of communication if that failure occurs.  Subjective:  Danielle Baldwin is a 30 y.o. G1P0 at [redacted]w[redacted]d being followed for ongoing prenatal care.  She is currently monitored for the following issues for this low-risk pregnancy and has Seborrheic dermatitis of scalp and Supervision of normal first pregnancy, antepartum on their problem list.  Patient reports vaginal irritation and clear vaginal discharge; no odor. Reports fetal flutters. Denies any contractions, bleeding or leaking of fluid.   The following portions of the patient's history were reviewed and updated as appropriate: allergies, current medications, past family history, past medical history, past social history, past surgical history and problem list.   Objective:   General:  Alert, oriented and cooperative.   Mental Status: Normal mood and affect perceived. Normal judgment and thought content.  Rest of physical exam deferred due to type of encounter  Wt 172 lb (78 kg)   LMP 09/11/2020 (Exact Date)   BMI 30.47 kg/m  **Patient doesn't own home BP cuff   Assessment and Plan:  Pregnancy: G1P0 at [redacted]w[redacted]d  1. Supervision of normal first  pregnancy, antepartum - Blood Pressure Monitoring (BLOOD PRESSURE MONITOR AUTOMAT) DEVI; 1 Device by Does not apply route daily. Automatic blood pressure cuff regular size. To monitor blood pressure regularly at home. ICD-10 code: O44.90  Dispense: 1 each; Refill: 0 - Misc. Devices (GOJJI WEIGHT SCALE) MISC; 1 Device by Does not apply route daily as needed. To weight self daily as needed at home. ICD-10 code: O78.90  Dispense: 1 each; Refill: 0 - Culture, OB Urine - POC Urinalysis Dipstick OB - Cervicovaginal ancillary only( [place]) - [place] MFM OB FOLLOW UP; Future - Advised to come in office later on today for self-swab and to leave a urine sample  2. [redacted] weeks gestation of pregnancy  3. Vaginal itching - Cervicovaginal ancillary only( [place])  4. Vaginal discharge during pregnancy, antepartum - Cervicovaginal ancillary only( [place])  5. Dysuria during pregnancy, antepartum - Culture, OB Urine - POC Urinalysis Dipstick OB  Preterm labor symptoms and general obstetric precautions including but not limited to vaginal bleeding, contractions, leaking of fluid and fetal movement were reviewed in detail with the patient.  I discussed the assessment and treatment plan with the patient. The patient was provided an opportunity to ask questions and all were answered. The patient agreed with the plan and demonstrated an understanding of the instructions. The patient was advised to call back or seek an in-person office evaluation/go to [facility] at [facility] for any urgent or concerning symptoms. Please refer to After Visit Summary for other counseling recommendations.   I provided 10 minutes of non-face-to-face time during this encounter. There was 5 minutes of chart review time spent prior  to this encounter. Total time spent = 15 minutes.  Return in about 4 weeks (around 03/02/2021) for Return OB - My Chart video.  Future Appointments  Date Time Provider Department Center   02/02/2021 11:10 AM Raelyn Mora, CNM [facility] None  03/02/2021 11:10 AM Raelyn Mora, CNM [facility] None  03/30/2021  8:30 AM Raelyn Mora, CNM [facility] None    Raelyn Mora, CNM Center for [employer], [facility]

## 2021-02-03 ENCOUNTER — Other Ambulatory Visit: Payer: Self-pay | Admitting: Obstetrics and Gynecology

## 2021-02-03 DIAGNOSIS — B9689 Other specified bacterial agents as the cause of diseases classified elsewhere: Secondary | ICD-10-CM

## 2021-02-03 LAB — CERVICOVAGINAL ANCILLARY ONLY
Bacterial Vaginitis (gardnerella): POSITIVE — AB
Candida Glabrata: NEGATIVE
Candida Vaginitis: NEGATIVE
Chlamydia: NEGATIVE
Comment: NEGATIVE
Comment: NEGATIVE
Comment: NEGATIVE
Comment: NEGATIVE
Comment: NEGATIVE
Comment: NORMAL
Neisseria Gonorrhea: NEGATIVE
Trichomonas: NEGATIVE

## 2021-02-03 MED ORDER — METRONIDAZOLE 500 MG PO TABS: 500.0000 mg | ORAL_TABLET | Freq: Two times a day (BID) | ORAL | 0 refills | Status: DC

## 2021-02-04 LAB — CULTURE, OB URINE

## 2021-02-04 LAB — URINE CULTURE, OB REFLEX

## 2021-03-02 ENCOUNTER — Telehealth (INDEPENDENT_AMBULATORY_CARE_PROVIDER_SITE_OTHER): Payer: Medicaid Other | Admitting: Medical

## 2021-03-02 ENCOUNTER — Other Ambulatory Visit: Payer: Self-pay

## 2021-03-02 ENCOUNTER — Encounter: Payer: Self-pay | Admitting: Medical

## 2021-03-02 VITALS — BP 119/70 | Wt 176.9 lb

## 2021-03-02 DIAGNOSIS — Z3400 Encounter for supervision of normal first pregnancy, unspecified trimester: Secondary | ICD-10-CM

## 2021-03-02 DIAGNOSIS — Z3A24 24 weeks gestation of pregnancy: Secondary | ICD-10-CM

## 2021-03-02 DIAGNOSIS — Z3402 Encounter for supervision of normal first pregnancy, second trimester: Secondary | ICD-10-CM

## 2021-03-02 NOTE — Progress Notes (Signed)
I connected with Danielle Baldwin 03/02/21 at 11:10 AM EDT by: MyChart video and verified that I am speaking with the correct person using two identifiers.  Patient is located at work and provider is located at [employer].     The purpose of this virtual visit is to provide medical care while limiting exposure to the novel coronavirus. I discussed the limitations, risks, security and privacy concerns of performing an evaluation and management service by MyChart video and the availability of in person appointments. I also discussed with the patient that there may be a patient responsible charge related to this service. By engaging in this virtual visit, you consent to the provision of healthcare.  Additionally, you authorize for your insurance to be billed for the services provided during this visit.  The patient expressed understanding and agreed to proceed.  The following staff members participated in the virtual visit:  Dorisann Frames, RN    PRENATAL VISIT NOTE  Subjective:  Danielle Baldwin is a 30 y.o. G1P0 at [redacted]w[redacted]d  for phone visit for ongoing prenatal care.  She is currently monitored for the following issues for this high-risk pregnancy and has Seborrheic dermatitis of scalp and Supervision of normal first pregnancy, antepartum on their problem list.  Patient reports no complaints.  Contractions: Not present. Vag. Bleeding: None.  Movement: Present. Denies leaking of fluid.   The following portions of the patient's history were reviewed and updated as appropriate: allergies, current medications, past family history, past medical history, past social history, past surgical history and problem list.   Objective:   Vitals:   03/02/21 1113  BP: 119/70  Weight: 176 lb 14.4 oz (80.2 kg)   Self-Obtained  Fetal Status:     Movement: Present     Assessment and Plan:  Pregnancy: G1P0 at [redacted]w[redacted]d 1. Supervision of normal first pregnancy, antepartum - Peds list given and discussed importance of  choosing peds prior to delivery  - Anticipatory guidance for TDap, GTT, CBC, RPR and HIV at next visit - Patient requesting alternatives to GTT, advised of option to use Jelly Beans or CBGs, plans to bring Jelly Beans  - Advised to be fasting prior to arrival - Recommendation for Chiropractor given at patient's request, information in AVS  2. [redacted] weeks gestation   Preterm labor symptoms and general obstetric precautions including but not limited to vaginal bleeding, contractions, leaking of fluid and fetal movement were reviewed in detail with the patient.  Return in about 4 weeks (around 03/30/2021) for LOB, In-Person, 28 week labs (fasting).  Future Appointments  Date Time Provider Department Center  03/30/2021  8:30 AM Raelyn Mora, CNM [facility] None     Time spent on virtual visit: 15 minutes  Vonzella Nipple, PA-C

## 2021-03-02 NOTE — Patient Instructions (Addendum)
Chiropractor - safe in pregnancy  [employer] [facility]  [address] [place], [place], [place] [address] [phone] Able to schedule online too: [email or web address]   Jelly Bean Glucose Test - Needs to be Brachs brand and bring at least 35 jelly beans   AREA PEDIATRIC/FAMILY PRACTICE PHYSICIANS  [place] ([address]) [facility] Deirdre Priest, MD; Lum Babe, MD; Sheffield Slider, MD; Leveda Anna, MD; McDiarmid, MD; Jerene Bears, MD; Jennette Kettle, MD; Gwendolyn Grant, MD [address] [place], [place] [address] [phone] Mon-Fri 8:30-12:30, 1:30-5:00 Providers come to see babies at [facility] Accepting [facility] [place] [facility] providers who accept newborns: Docia Chuck, MD; Kateri Plummer, MD; Paulino Rily, MD [address], [place], [place] [address] [phone] Mon-Fri 8:00-5:30 Babies seen by providers at [facility] Does NOT accept Medicaid Please call early in hospitalization for appointment (limited availability)  [facility] Gannett, MD [address], [place], [place] [address] [phone] Mon, Tue, Thur, Fri 8:30-5:00, Wed 10:00-7:00 (closed 1-2pm) Babies seen by [facility] providers Accepting Medicaid Donnie Coffin - Pediatrician Donnie Coffin, MD [address], [place], [place] [address] [phone] Mon-Fri 8:30-5:00, Sat 8:30-12:00 Provider comes to see babies at [facility] Accepting Medicaid Must have been referred from current patients or contacted office prior to delivery [facility]) Manson Passey, MD; Ave Filter, MD; Luna Fuse, MD; Kennedy Bucker, MD; Konrad Dolores, MD; Kathlene November, MD; Jenne Campus, MD; Lubertha South, MD; Wynetta Emery, MD; Duffy Rhody, MD; Gerre Couch, NP; Shirl Harris, NP [address] [place], [place], [place] [address] [phone] Mon, Halford Decamp, Thur, Fri 8:30-5:30, Wed 9:30-5:30, Sat 8:30-12:30 Babies seen by [facility]  providers Accepting Medicaid Only accepting infants of first-time parents or siblings of current patients Hospital discharge coordinator will make follow-up appointment Cyril Mourning [address] [place], [place]  [address] [phone]   Fax - [phone] [facility] [address], [place], [place]  [address] Phone - [phone]   Fax - [phone] Lucio Edward [address] [place], [place]  [address] [phone]  [place] [phone]) [facility] Jenne Pane, MD; Alita Chyle, MD; Princella Ion, MD; MD; Earlene Plater, MD; Jamesetta Orleans, MD; Alvera Novel, MD; Clarene Duke, MD; Rana Snare, MD; Carmon Ginsberg, MD; Alinda Money, MD; Hosie Poisson, MD; Mayford Knife, MD [address] [place], [place] [address] [phone] Mon-Fri 8:30-5:00 (extended evenings Mon-Thur as needed), Sat-Sun 10:00-1:00 Providers come to see babies at [facility] Accepting Medicaid for families of first-time babies and families with all children in the household age 23 and under. Must register with office prior to making appointment (M-F only). [facility] Suezanne Jacquet, NP; Lynelle Doctor, MD; Susann Givens, MD; [place], [place] [address], [place], [place] [address] [phone] Mon-Fri 8:00-5:00 Babies seen by providers at [facility] Does NOT accept Medicaid/Commercial Insurance Only [facility] [place] ([facility])  Holly Bodily, MD; Zachery Dauer, MD; Stefan Church, MD; Sabino Dick, MD; Quitman Livings, MD; Farris Has, MD; Gaynell Face, MD; Betha Loa, MD; Colon Flattery, MD; [place] James, MD [address] [place], [place] [address] [phone] Mon-Fri 8:30-5:30, Sat (Oct.-Mar.) 9:00-1:00 Babies seen by providers at [facility] Accepting [facility]  [place] [phone]) [facility] Nestle, MD; Sheliah Hatch, MD [address], [place], [place] [address] [phone] Mon-Fri 8:30-5:00, Sat 8:30-12:00 Providers come to see babies at [facility] Does NOT accept  [facility] at Lutricia Feil, [place]; Tracie Harrier, MD; [place], [place]; Wynelle Link, MD; Azucena Cecil, MD [address], [place], [place] [address] [phone] Mon-Fri 8:00-5:00 Babies seen by providers at  [facility] Does NOT accept Medicaid Only accepting babies of parents who are patients Please call early in hospitalization for appointment (limited availability) [facility] Chestine Spore, MD; Abran Cantor, MD; Early Osmond, MD; Cherre Huger, NP; Hyacinth Meeker, MD; Dwan Bolt, MD; Jarold Motto, NP; Dario Guardian, MD; Talmage Nap, MD; Maisie Fus, MD; Pricilla Holm, MD; Tama High, MD [address] [place], [place], [place] [address] [phone] Mon-Fri 8:00-5:00, Sat 9:00-12:00 Providers come to see babies at [facility] Does NOT accept [facility] [phone]) [facility] Spr[facility] at [facility] providers accepting new patients: Drema Pry, NP; Delena Serve, PA [address] [place], [place] [address] [phone] Mon-Fri 8:00-5:00 Babies seen by providers at [facility] Does NOT accept Medicaid Only accepting babies of parents who are patients Please call early in hospitalization for appointment (limited availability) [facility] Cardell Peach, MD; Nash Dimmer, MD [address] [place], [place] [address] [phone] (press 1 to schedule appointment) Mon-Fri 8:00-5:00 Providers come to see babies at [facility] Does NOT accept [facility], MD [address] [place], [place] [address] [phone] Mon-Fri 8:30-5:00 (lunch 12:30-1:00), extended hours by appointment only Wed 5:00-6:30 Babies seen by [facility] providers Accepting Medicaid [facility] Carmine, MD; [place], MD; Hassan Rowan, MD [address] [place], [place], [place] [address] [phone] Mon-Fri 8:00-5:00 Babies seen by [facility] providers Does NOT accept Medicaid [facility] Boykin Peek, MD; Durene Cal, MD; [place], [place] [address]  [place], [place] [address] [phone] Mon-Fri 8:00-5:00 Babies seen by [facility] providers Does NOT accept [facility] [place], [place]; [place], [place]; [place], [place]; Avis Epley, MD; Vonna Kotyk, MD; Clance Boll, MD; Stevphen Rochester, NP; Arvilla Market, NP; Ann Maki, NP; Otis Dials, NP; Vaughan Basta, MD; Eartha Inch, MD [address] [place], [place] [phone] Mon-Fri 8:30-5:00, Sat 10:00-1:00 Providers come to see babies at [facility] Does NOT accept Medicaid Free prenatal information session Tuesdays at 4:45pm [facility] Stanton, MD; [place], [place]; [place], [place]; [place], [place] [address] [place] [address] [phone] Mon-Fri 7:30-5:30 Babies seen by [facility] providers [facility] Doctor [address], [place], [place]  [address] [phone]   Fax - [phone]  [place] [phone] & [phone]) [facility], MD [address] [place], [place] [address] [phone] Mon-Thur 8:00-6:00 Providers come to see babies at [facility] Accepting Medicaid [facility] Dareen Piano, NP; Cyndia Bent, MD; [place], [place]; [place], [place] [address] [place], [place] [address] [phone] Mon-Thur 7:30-7:30, Fri 7:30-4:30 Babies seen by [facility] providers Accepting [facility] Juanito Doom, MD; Janene Harvey, NP; Vonita Moss, MD [address], [place], [place] [address] [phone] Mon-Fri 8:30-5:00, Sat 8:30-12:00 Providers come to see babies at [facility] Accepting Medicaid Must have "Meet & Greet" appointment at office prior to delivery [facility]) Marlow Baars, MD; Earlene Plater, MD; Lucretia Roers, MD [address], [place], [place] [address] [phone] Mon-Wed 8:00-6:00, Thur-Fri 8:00-5:00, Sat 9:00-12:00 Providers come to see babies at [facility] Does NOT accept Medicaid Only accepting siblings  of current patients [facility]  [address], [place], [place]  [address] [phone]   Fax - [phone] [facility] [address] [place], [place]  [address] [phone]   Fax - [phone]  [place] [phone] & [phone]) Adult nurse [facility], [place]; [place], DO [address], [place], [place] [address] [phone] Mon-Fri 7:00-5:00 Babies seen by [facility] providers Does NOT accept Medicaid [facility] [place], MD; [place], [place]; [place], [place] [address], [place], [place] [address] [phone] Mon-Fri 8:00-5:00 Babies seen by [facility] providers Accepting [facility] - Dorann Lodge Poneto, MD; [place], [place]; [place], NP; [place], [place] [address] [place], [place], [place] [address] [phone] Mon-Fri 8:00-5:00 Babies seen by providers at [facility] Accepting [facility] [place] [phone]) [facility] Primary Care at [facility] [place], [place] [address] [place], [place] [address] [phone] Mon-Fri 8:00-5:00 Babies seen by [facility] providers Does NOT accept Medicaid Limited availability, please call early in hospitalization to schedule follow-up [facility] Jeanelle Malling, [place]; Eddie Candle, MD; Normand Sloop, MD; [place], [place]; Constance Goltz, MD; [place], [place] [address] [facility] [address], [place], [place] [address] [phone] Mon-Fri 8:30-5:00, Sat 9:00-12:00 Babies seen by providers at [facility] Accepting Medicaid Please register online then schedule online or call office [email or web address] - [facility] [place] [facility]) Durene Cal, NP; Lucianne Muss, MD; [place], [place] [address], [place], [place] [address] [phone] Mon-Fri 8:00-5:00 Babies seen by providers at [facility] Accepting [facility] - [facility] ([facility]) [place], MD; Reed Breech, NP; Shelva Majestic, MD [address], [place], [place] [address] [phone] Mon-Fri 8:00-5:30, Sat&Sun by appointment (phones open at 8:30) Babies seen by [facility] providers Accepting Medicaid Must be a first-time baby or sibling of current patient [facility]  [address], [place], [place]  [address] [phone]   Fax - [phone]  [address] [phone] & [phone]) [facility] [place], [place]; [place], [place]; [place], MD; [place], [place]; Carolyne Fiscal, MD [address] [place], [place] [address] [phone] Mon-Thur 8:00-7:00, Fri 8:00-5:00, Sat 8:00-12:00, Sun 9:00-12:00 Babies seen by [facility] providers Accepting Medicaid [facility] Medicine at Liana Gerold, MD; Gaynell Face, MD; [facility], MD [address], [place], [place] [address] [phone] Mon-Thur 8:00-5:00 Babies seen by providers at [facility] Accepting Medicaid [facility] Family Medicine at Dorthey Sawyer, MD; [place], MD; Madilyn Fireman, MD; Melvyn Neth, MD; List, MD; Lazarus Salines, MD; Gaynell Face, MD; Berneda Rose, MD; Flora Lipps, MD; Beryl Meager, MD; Luther Redo, MD; Lavonia Drafts, MD; Kellie Simmering, MD [address] [place], [place] [address] [phone] Mon-Fri 8:00-5:30, Sat (Oct.-Mar.) 9:00-1:00 Babies seen by providers at [facility] Accepting Medicaid Must fill out new patient packet, available online at [email or web address] [facility] - [place] [facility]) Spero Geralds, NP; Tiburcio Pea, NP; Tresa Endo, NP; Whitney Post, MD; [place], [place]; Hennie Duos, MD; [place], MD; Kavin Leech, NP [address], [place], [place] [address] [phone] Mon-Thur 8:00-5:30, Fri 8:00-5:00 Babies seen by providers at [facility] Accepting [facility]  [place] [phone]) [facility]  Family Medicine [place], [place]; [place], MD; [place], MD; [place], [place] [address] [place], [place] [address] [phone] Mon-Fri 8:00-5:00 Babies seen by providers at [facility] Accepting [facility]   [place] [phone]) [facility] at [facility], [place]; Lenise Arena, MD; [place], [place] [address], [place], [place] [address] [phone] Mon-Fri 8:00-5:00 Babies seen by providers at [facility] Does NOT accept Medicaid Limited appointment availability, please call early in hospitalization  [facility], [place]; [place], MD [address] [place], [place] [address] [phone] Mon-Fri 8:00-5:00 Babies seen by [facility] providers Does NOT accept [facility] - [facility], MD; Ninetta Lights, MD;  Barth [place], [place]; LewisNayak, MD [address], [place], [place] [address] [phone] Mon-Fri 8:00-5:00 After hours clinic [facility]([address] [place], [place] [address]) [phone] Mon-Fri 5:00-8:00, Sat 12:00-6:00, Sun 10:00-4:00 Babies seen by [facility] providers Accepting [facility] at [facility] [address], [place], [place]  [id number] [phone]   Fax - [phone]  [place] [phone]) Adult nurse[facility]auer [facility] at [facility] Andy, MD [address] [place], [place], [place] [phone] Mon-Fri 8:00-5:00 Babies seen by [facility] providers Does NOT accept Medicaid [facility] [place] [facility] [place]) Rene KocherEksir, MD [address], [place], [place] [address] [phone] Mon-Thur 8:00-7:00, Fri 8:00-5:00, Sat 8:00-12:00 Babies seen by providers at [facility] Accepting Medicaid - but does not have vaccinations in office (must be received elsewhere) Limited availability, please call early in hospitalization  [place] [phone]) [facility]  Wyvonne Lenzharlene Flemming, MD [address],  [place] [address] [phone]  Fax [phone]

## 2021-03-08 ENCOUNTER — Ambulatory Visit: Payer: Medicaid Other

## 2021-03-08 ENCOUNTER — Telehealth: Payer: Self-pay | Admitting: Obstetrics and Gynecology

## 2021-03-08 NOTE — Telephone Encounter (Signed)
Called patient twice about her ultrasound appointment, and sent a MyChart message that she has not read yet.

## 2021-03-13 ENCOUNTER — Telehealth: Payer: Self-pay | Admitting: Obstetrics and Gynecology

## 2021-03-13 NOTE — Telephone Encounter (Signed)
Attempted to reach patient about scheduling an ultrasound appointment. Was not able to reach her, but a message was left for her to call the office.

## 2021-03-30 ENCOUNTER — Other Ambulatory Visit: Payer: Self-pay

## 2021-03-30 ENCOUNTER — Ambulatory Visit (INDEPENDENT_AMBULATORY_CARE_PROVIDER_SITE_OTHER): Payer: BC Managed Care – PPO | Admitting: Obstetrics and Gynecology

## 2021-03-30 ENCOUNTER — Encounter: Payer: Self-pay | Admitting: Obstetrics and Gynecology

## 2021-03-30 VITALS — BP 119/75 | HR 105 | Temp 98.3°F | Wt 193.4 lb

## 2021-03-30 DIAGNOSIS — Z3400 Encounter for supervision of normal first pregnancy, unspecified trimester: Secondary | ICD-10-CM | POA: Diagnosis not present

## 2021-03-30 DIAGNOSIS — Z3A28 28 weeks gestation of pregnancy: Secondary | ICD-10-CM

## 2021-03-30 DIAGNOSIS — R030 Elevated blood-pressure reading, without diagnosis of hypertension: Secondary | ICD-10-CM

## 2021-03-30 NOTE — Progress Notes (Addendum)
   LOW-RISK PREGNANCY OFFICE VISIT Patient name: Danielle Baldwin MRN [id number]  Date of birth: [birth year] Chief Complaint:   Routine Prenatal Visit  History of Present Illness:   Danielle Baldwin is a 30 y.o. G1P0 female at [redacted]w[redacted]d with an Estimated Date of Delivery: 06/18/21 being seen today for ongoing management of a low-risk pregnancy.  Today she reports no complaints. Has been seeing a chiropractor, but not sure she is satisfied with her services. She is in the process of looking for another chiropractor, but will give this one another 2 weeks of services to determine if she will stay with her.  Contractions: Not present. Vag. Bleeding: None.  Movement: Present. denies leaking of fluid. Review of Systems:   Pertinent items are noted in HPI Denies abnormal vaginal discharge w/ itching/odor/irritation, headaches, visual changes, shortness of breath, chest pain, abdominal pain, severe nausea/vomiting, or problems with urination or bowel movements unless otherwise stated above. Pertinent History Reviewed:  Reviewed past medical,surgical, social, obstetrical and family history.  Reviewed problem list, medications and allergies. Physical Assessment:   Vitals:   03/30/21 0839  BP: (!) 142/94  Pulse: 100  Temp: 98.3 F (36.8 C)  Weight: 193 lb 6.4 oz (87.7 kg)  REPEAT BP: 119/75 P: 105 Body mass index is 34.26 kg/m.         Physical Examination:   General appearance: Well appearing, and in no distress  Mental status: Alert, oriented to person, place, and time  Skin: Warm & dry  Cardiovascular: Normal heart rate noted  Respiratory: Normal respiratory effort, no distress  Abdomen: Soft, gravid, nontender  Pelvic: Cervical exam deferred         Extremities: Edema: None  Fetal Status: Fetal Heart Rate (bpm): 160   Movement: Present    No results found for this or any previous visit (from the past 24 hour(s)).  Assessment & Plan:  1) Low-risk pregnancy G1P0 at [redacted]w[redacted]d with an Estimated Date of  Delivery: 06/18/21   2) Supervision of normal first pregnancy, antepartum  - Glucose Tolerance, 2 Hours w/1 Hour,  - HIV Antibody (routine testing w rflx),  - CBC,  - RPR  3) [redacted] weeks gestation of pregnancy   Meds: No orders of the defined types were placed in this encounter.  Labs/procedures today: 2 hr GTT, 3rd trimester labs, TdaP  Plan:  Continue routine obstetrical care   Reviewed: Preterm labor symptoms and general obstetric precautions including but not limited to vaginal bleeding, contractions, leaking of fluid and fetal movement were reviewed in detail with the patient.  All questions were answered. Has home bp cuff. Check bp weekly, let us know if >140/90.   Follow-up: No follow-ups on file.  Orders Placed This Encounter  Procedures   Glucose Tolerance, 2 Hours w/1 Hour   HIV Antibody (routine testing w rflx)   CBC   RPR   Raelyn Mora MSN, CNM 03/30/2021 9:09 AM

## 2021-03-31 ENCOUNTER — Encounter: Payer: Self-pay | Admitting: Obstetrics and Gynecology

## 2021-03-31 ENCOUNTER — Other Ambulatory Visit: Payer: Self-pay | Admitting: Obstetrics and Gynecology

## 2021-03-31 DIAGNOSIS — O99013 Anemia complicating pregnancy, third trimester: Secondary | ICD-10-CM

## 2021-03-31 DIAGNOSIS — O99019 Anemia complicating pregnancy, unspecified trimester: Secondary | ICD-10-CM | POA: Insufficient documentation

## 2021-03-31 LAB — CBC
Hematocrit: 32 % — ABNORMAL LOW (ref 34.0–46.6)
Hemoglobin: 10.8 g/dL — ABNORMAL LOW (ref 11.1–15.9)
MCH: 32.7 pg (ref 26.6–33.0)
MCHC: 33.8 g/dL (ref 31.5–35.7)
MCV: 97 fL (ref 79–97)
Platelets: 179 10*3/uL (ref 150–450)
RBC: 3.3 x10E6/uL — ABNORMAL LOW (ref 3.77–5.28)
RDW: 11.9 % (ref 11.7–15.4)
WBC: 8.5 10*3/uL (ref 3.4–10.8)

## 2021-03-31 LAB — HIV ANTIBODY (ROUTINE TESTING W REFLEX): HIV Screen 4th Generation wRfx: NONREACTIVE

## 2021-03-31 LAB — GLUCOSE TOLERANCE, 2 HOURS W/ 1HR
Glucose, 1 hour: 150 mg/dL (ref 65–179)
Glucose, 2 hour: 112 mg/dL (ref 65–152)
Glucose, Fasting: 88 mg/dL (ref 65–91)

## 2021-03-31 LAB — RPR: RPR Ser Ql: NONREACTIVE

## 2021-03-31 MED ORDER — FERROUS SULFATE 325 (65 FE) MG PO TBEC: 325.0000 mg | DELAYED_RELEASE_TABLET | ORAL | 2 refills | Status: DC

## 2021-03-31 MED ORDER — ASCORBIC ACID 500 MG PO TABS: 500.0000 mg | ORAL_TABLET | ORAL | 3 refills | Status: DC

## 2021-04-04 ENCOUNTER — Ambulatory Visit: Payer: Medicaid Other

## 2021-04-10 ENCOUNTER — Ambulatory Visit: Payer: Medicaid Other

## 2021-04-10 ENCOUNTER — Telehealth: Payer: Self-pay | Admitting: *Deleted

## 2021-04-10 NOTE — Telephone Encounter (Signed)
Tried calling patient this AM regarding message received from triage line. Patient called yesterday reporting BP 142/81 and swollen feet. Will send a Mychart message.  Clovis Pu, RN

## 2021-04-13 ENCOUNTER — Other Ambulatory Visit: Payer: Self-pay

## 2021-04-13 ENCOUNTER — Telehealth (INDEPENDENT_AMBULATORY_CARE_PROVIDER_SITE_OTHER): Payer: Medicaid Other | Admitting: Obstetrics and Gynecology

## 2021-04-13 VITALS — BP 129/76 | HR 99 | Wt 191.5 lb

## 2021-04-13 DIAGNOSIS — O99013 Anemia complicating pregnancy, third trimester: Secondary | ICD-10-CM

## 2021-04-13 DIAGNOSIS — Z3A30 30 weeks gestation of pregnancy: Secondary | ICD-10-CM

## 2021-04-13 DIAGNOSIS — D649 Anemia, unspecified: Secondary | ICD-10-CM

## 2021-04-13 DIAGNOSIS — Z3400 Encounter for supervision of normal first pregnancy, unspecified trimester: Secondary | ICD-10-CM

## 2021-04-13 NOTE — Progress Notes (Signed)
MY CHART VIDEO VIRTUAL OBSTETRICS VISIT ENCOUNTER NOTE  I connected with Danielle Baldwin on 04/13/21 at  4:10 PM EDT by My Chart video in the car while her mother drives her home from work and verified that I am speaking with the correct person using two identifiers. Provider verified with patient that it is ok to have her mother present during her visit. Patient provided consent for all medical items to be discussed in the presence of her mother. Provider located at [employer] for [employer] at [place].   I discussed the limitations, risks, security and privacy concerns of performing an evaluation and management service by My Chart video and the availability of in person appointments. I also discussed with the patient that there may be a patient responsible charge related to this service. The patient expressed understanding and agreed to proceed.  I discussed the limitations of telemedicine and the availability of in person appointments.  Discussed there is a possibility of technology failure and discussed alternative modes of communication if that failure occurs.  Subjective:  Danielle Baldwin is a 30 y.o. G1P0 at [redacted]w[redacted]d being followed for ongoing prenatal care.  She is currently monitored for the following issues for this low-risk pregnancy and has Seborrheic dermatitis of scalp; Supervision of normal first pregnancy, antepartum; and Anemia affecting pregnancy on their problem list.  Patient reports no complaints. Reports fetal movement. Denies any contractions, bleeding or leaking of fluid.   The following portions of the patient's history were reviewed and updated as appropriate: allergies, current medications, past family history, past medical history, past social history, past surgical history and problem list.   Objective:   General:  Alert, oriented and cooperative.   Mental Status: Normal mood and affect perceived. Normal judgment and thought content.  Rest of physical exam deferred due  to type of encounter  BP 129/76   Pulse 99   Wt 191 lb 8 oz (86.9 kg)   LMP 09/11/2020 (Exact Date)   BMI 33.92 kg/m  **Patient will send weight and VS via MyChart message. She reports "I take them on Sundays and I left the paper at home." Assessment and Plan:  Pregnancy: G1P0 at [redacted]w[redacted]d  Supervision of normal first pregnancy, antepartum  2.   [redacted] weeks gestation of pregnancy  3. Anemia affecting pregnancy in third trimester  - Advised to continue taking FeSO4 and Vitamin C every other day   Preterm labor symptoms and general obstetric precautions including but not limited to vaginal bleeding, contractions, leaking of fluid and fetal movement were reviewed in detail with the patient.  I discussed the assessment and treatment plan with the patient. The patient was provided an opportunity to ask questions and all were answered. The patient agreed with the plan and demonstrated an understanding of the instructions. The patient was advised to call back or seek an in-person office evaluation/go to MAU at [facility] for any urgent or concerning symptoms. Please refer to After Visit Summary for other counseling recommendations.   I provided 5 minutes of non-face-to-face time during this encounter. There was 5 minutes of chart review time spent prior to this encounter. Total time spent = 10 minutes.  Return in about 4 weeks (around 05/11/2021) for Return OB - My Chart video.  Future Appointments  Date Time Provider Department Center  04/24/2021  3:15 PM [facility] NURSE [facility] [place]  04/24/2021  3:30 PM [facility] US3 [facility]  04/27/2021  1:30 PM Raelyn Mora, CNM [facility] None  05/12/2021  9:35 AM Gerrit Heck, CNM [facility] None  05/26/2021  8:55 AM Brand Males, CNM [facility] None  06/01/2021 10:15 AM Raelyn Mora, CNM [facility] None    Raelyn Mora, CNM Center for [employer], [facility]

## 2021-04-17 ENCOUNTER — Encounter: Payer: Self-pay | Admitting: Obstetrics and Gynecology

## 2021-04-24 ENCOUNTER — Encounter: Payer: Self-pay | Admitting: *Deleted

## 2021-04-24 ENCOUNTER — Ambulatory Visit: Payer: BC Managed Care – PPO | Attending: Obstetrics and Gynecology

## 2021-04-24 ENCOUNTER — Other Ambulatory Visit: Payer: Self-pay

## 2021-04-24 ENCOUNTER — Ambulatory Visit: Payer: BC Managed Care – PPO | Admitting: *Deleted

## 2021-04-24 VITALS — BP 131/71 | HR 104

## 2021-04-24 DIAGNOSIS — O99013 Anemia complicating pregnancy, third trimester: Secondary | ICD-10-CM | POA: Insufficient documentation

## 2021-04-24 DIAGNOSIS — Z3400 Encounter for supervision of normal first pregnancy, unspecified trimester: Secondary | ICD-10-CM | POA: Insufficient documentation

## 2021-04-27 ENCOUNTER — Telehealth (INDEPENDENT_AMBULATORY_CARE_PROVIDER_SITE_OTHER): Payer: Medicaid Other | Admitting: Obstetrics and Gynecology

## 2021-04-27 ENCOUNTER — Other Ambulatory Visit: Payer: Self-pay

## 2021-04-27 VITALS — BP 139/83 | HR 100 | Wt 196.2 lb

## 2021-04-27 DIAGNOSIS — R030 Elevated blood-pressure reading, without diagnosis of hypertension: Secondary | ICD-10-CM

## 2021-04-27 DIAGNOSIS — O163 Unspecified maternal hypertension, third trimester: Secondary | ICD-10-CM

## 2021-04-27 DIAGNOSIS — O99013 Anemia complicating pregnancy, third trimester: Secondary | ICD-10-CM

## 2021-04-27 DIAGNOSIS — Z3A32 32 weeks gestation of pregnancy: Secondary | ICD-10-CM

## 2021-04-27 DIAGNOSIS — O99019 Anemia complicating pregnancy, unspecified trimester: Secondary | ICD-10-CM

## 2021-04-27 DIAGNOSIS — O26893 Other specified pregnancy related conditions, third trimester: Secondary | ICD-10-CM

## 2021-04-27 DIAGNOSIS — D649 Anemia, unspecified: Secondary | ICD-10-CM

## 2021-04-27 DIAGNOSIS — Z3400 Encounter for supervision of normal first pregnancy, unspecified trimester: Secondary | ICD-10-CM

## 2021-05-01 ENCOUNTER — Other Ambulatory Visit: Payer: Self-pay

## 2021-05-01 ENCOUNTER — Ambulatory Visit (INDEPENDENT_AMBULATORY_CARE_PROVIDER_SITE_OTHER): Payer: Medicaid Other | Admitting: *Deleted

## 2021-05-01 VITALS — BP 123/74 | HR 94 | Temp 97.9°F | Ht 63.0 in | Wt 201.6 lb

## 2021-05-01 DIAGNOSIS — R030 Elevated blood-pressure reading, without diagnosis of hypertension: Secondary | ICD-10-CM

## 2021-05-01 NOTE — Progress Notes (Signed)
   Subjective:  Danielle Baldwin is a 30 y.o. female here for BP check.   Hypertension ROS: no TIA's, no chest pain on exertion, no dyspnea on exertion, no swelling of ankles, no orthostatic dizziness or lightheadedness, no orthopnea or paroxysmal nocturnal dyspnea, and no palpitations.    Objective:  BP 123/74 (BP Location: Left Arm, Patient Position: Sitting, Cuff Size: Large)   Pulse 94   Temp 97.9 F (36.6 C) (Oral)   Ht 5\' 3"  (1.6 m)   Wt 201 lb 9.6 oz (91.4 kg)   LMP 09/11/2020 (Exact Date)   BMI 35.71 kg/m   Appearance alert, well appearing, and in no distress, oriented to person, place, and time, normal appearing weight, and overweight. General exam BP noted to be well controlled today in office.    Assessment:   Blood Pressure stable and asymptomatic.   Plan:  PEC labs completed per 11/09/2020, CNM orders Current treatment plan is effective, no change in therapy.

## 2021-05-02 ENCOUNTER — Encounter: Payer: Self-pay | Admitting: *Deleted

## 2021-05-02 ENCOUNTER — Encounter: Payer: Self-pay | Admitting: Obstetrics and Gynecology

## 2021-05-02 LAB — COMPREHENSIVE METABOLIC PANEL
ALT: 13 IU/L (ref 0–32)
AST: 13 IU/L (ref 0–40)
Albumin/Globulin Ratio: 1.3 (ref 1.2–2.2)
Albumin: 3.3 g/dL — ABNORMAL LOW (ref 3.9–5.0)
Alkaline Phosphatase: 134 IU/L — ABNORMAL HIGH (ref 44–121)
BUN/Creatinine Ratio: 8 — ABNORMAL LOW (ref 9–23)
BUN: 4 mg/dL — ABNORMAL LOW (ref 6–20)
Bilirubin Total: <0.2 mg/dL (ref 0.0–1.2)
CO2: 19 mmol/L — ABNORMAL LOW (ref 20–29)
Calcium: 8.4 mg/dL — ABNORMAL LOW (ref 8.7–10.2)
Chloride: 105 mmol/L (ref 96–106)
Creatinine, Ser: 0.52 mg/dL — ABNORMAL LOW (ref 0.57–1.00)
Globulin, Total: 2.5 g/dL (ref 1.5–4.5)
Glucose: 89 mg/dL (ref 65–99)
Potassium: 3.8 mmol/L (ref 3.5–5.2)
Sodium: 137 mmol/L (ref 134–144)
Total Protein: 5.8 g/dL — ABNORMAL LOW (ref 6.0–8.5)
eGFR: 131 mL/min/{1.73_m2} (ref >59)

## 2021-05-02 LAB — CBC
Hematocrit: 30.7 % — ABNORMAL LOW (ref 34.0–46.6)
Hemoglobin: 10.6 g/dL — ABNORMAL LOW (ref 11.1–15.9)
MCH: 31.2 pg (ref 26.6–33.0)
MCHC: 34.5 g/dL (ref 31.5–35.7)
MCV: 90 fL (ref 79–97)
Platelets: 190 10*3/uL (ref 150–450)
RBC: 3.4 x10E6/uL — ABNORMAL LOW (ref 3.77–5.28)
RDW: 11.6 % — ABNORMAL LOW (ref 11.7–15.4)
WBC: 8.1 10*3/uL (ref 3.4–10.8)

## 2021-05-02 LAB — PROTEIN / CREATININE RATIO, URINE
Creatinine, Urine: 168.9 mg/dL
Protein, Ur: 30.8 mg/dL
Protein/Creat Ratio: 182 mg/g creat (ref 0–200)

## 2021-05-02 NOTE — Progress Notes (Addendum)
MY CHART VIDEO VIRTUAL OBSTETRICS VISIT ENCOUNTER NOTE  I connected with Danielle Baldwin on 04/27/21 at  1:30 PM EDT by My Chart video at home and verified that I am speaking with the correct person using two identifiers. Provider located at [employer] for [employer] at [place].   I discussed the limitations, risks, security and privacy concerns of performing an evaluation and management service by My Chart video and the availability of in person appointments. I also discussed with the patient that there may be a patient responsible charge related to this service. The patient expressed understanding and agreed to proceed.  I discussed the limitations of telemedicine and the availability of in person appointments.  Discussed there is a possibility of technology failure and discussed alternative modes of communication if that failure occurs.  Subjective:  Danielle Baldwin is a 30 y.o. G1P0 at [redacted]w[redacted]d being followed for ongoing prenatal care.  She is currently monitored for the following issues for this low-risk pregnancy and has Seborrheic dermatitis of scalp; Supervision of normal first pregnancy, antepartum; and Anemia affecting pregnancy on their problem list.  Patient reports no complaints. She took herself out of work and needs her paperwork completed Reports fetal movement. Denies any contractions, bleeding or leaking of fluid.   The following portions of the patient's history were reviewed and updated as appropriate: allergies, current medications, past family history, past medical history, past social history, past surgical history and problem list.   Objective:   General:  Alert, oriented and cooperative.   Mental Status: Normal mood and affect perceived. Normal judgment and thought content.  Rest of physical exam deferred due to type of encounter  BP 139/83   Pulse 100   Wt 196 lb 3.2 oz (89 kg)   LMP 09/11/2020 (Exact Date)   BMI 34.76 kg/m  **Done by patient's own at home BP cuff  and scale  Assessment and Plan:  Pregnancy: G1P0 at [redacted]w[redacted]d  1. Supervision of normal first pregnancy, antepartum - Ok to be out of work, but with understanding that she will have a limited PP/bonding period - Advised to bring FMLA papers to RN for completion giving dates for when leave began and for how long she will be out of work - Anticipatory guidance for GBS screening at 36 wks. Explained the test is important to be done at this time in pregnancy to ensure adequate treatment at the time of delivery. Explained that a positive result does not mean any harm to her, but can be harmful to the baby. Meaning that if baby is exposed to the bacteria for too long without antibiotics, the bay has the potential to develop pneumonia, septicemia, or spinal meningitis and could end up in the NICU. Also, explained that a cervical exam may be performed at the time of testing to get a baseline cervical check and make sure there is no preterm cervical dilation.  2. Anemia affecting pregnancy, antepartum - Taking iron and vitamin C supplements  3. Elevated blood pressure affecting pregnancy in third trimester, antepartum  - PEC labs and BP check next week  4. [redacted] weeks gestation of pregnancy    Preterm labor symptoms and general obstetric precautions including but not limited to vaginal bleeding, contractions, leaking of fluid and fetal movement were reviewed in detail with the patient.  I discussed the assessment and treatment plan with the patient. The patient was provided an opportunity to ask questions and all were answered. The patient agreed with the plan and demonstrated  an understanding of the instructions. The patient was advised to call back or seek an in-person office evaluation/go to MAU at [facility] for any urgent or concerning symptoms. Please refer to After Visit Summary for other counseling recommendations.   I provided 5 minutes of non-face-to-face time during this encounter.  There was 5 minutes of chart review time spent prior to this encounter. Total time spent = 10 minutes.  Return in about 4 weeks (around 05/25/2021).  Future Appointments  Date Time Provider Department Center  05/12/2021  9:35 AM Gerrit Heck, CNM [facility] None  05/26/2021  8:55 AM Brand Males, CNM [facility] None  06/01/2021 10:15 AM Raelyn Mora, CNM [facility] None    Raelyn Mora, CNM Center for [employer], [facility]

## 2021-05-09 ENCOUNTER — Ambulatory Visit (INDEPENDENT_AMBULATORY_CARE_PROVIDER_SITE_OTHER): Payer: Medicaid Other | Admitting: *Deleted

## 2021-05-09 ENCOUNTER — Other Ambulatory Visit: Payer: Self-pay

## 2021-05-09 ENCOUNTER — Telehealth: Payer: Self-pay | Admitting: *Deleted

## 2021-05-09 VITALS — BP 135/85 | HR 106 | Temp 98.3°F | Ht 63.0 in | Wt 204.4 lb

## 2021-05-09 DIAGNOSIS — R030 Elevated blood-pressure reading, without diagnosis of hypertension: Secondary | ICD-10-CM

## 2021-05-09 NOTE — Progress Notes (Signed)
    Subjective:  Danielle Baldwin is a 30 y.o. female here for BP check.   Hypertension ROS: no TIA's, no chest pain on exertion, no dyspnea on exertion, no swelling of ankles, no orthostatic dizziness or lightheadedness, no orthopnea or paroxysmal nocturnal dyspnea, and no palpitations.    Objective:  BP 135/85 (BP Location: Left Arm, Patient Position: Sitting, Cuff Size: Large)   Pulse (!) 106   Temp 98.3 F (36.8 C) (Oral)   Wt 204 lb 6.4 oz (92.7 kg)   LMP 09/11/2020 (Exact Date)   BMI 36.21 kg/m   Appearance alert, well appearing, and in no distress, oriented to person, place, and time, and normal appearing weight. General exam BP noted to be well controlled today in office.    Assessment:   Blood Pressure stable and asymptomatic.   Plan:  Current treatment plan is effective, no change in therapy.. See telephone note from 05/09/21.  Clovis Pu, RN

## 2021-05-09 NOTE — Telephone Encounter (Signed)
Patient called stating her blood pressure on 05/07/21 Sunday 8 AM right arm 147/93 hr 101. At 11:22 PM Sunday 166/91 hr 85. Repeat BP Left arm Sunday at 11:44 PM 134/64. BP on Monday 05/08/21 at 9:07 PM 144/80 hr 99. Patient reported that feet are still slightly swollen, however not as bad as Sunday. Advised patient that the blood pressure readings are high along with swelling of feet. She should have went to MAU Sunday. Patient placed on nurse schedule for BP check. Patient stated that she did not want to go to the hospital because it's her birthday.   Clovis Pu, RN

## 2021-05-11 ENCOUNTER — Telehealth: Payer: Self-pay | Admitting: *Deleted

## 2021-05-11 ENCOUNTER — Encounter: Payer: Self-pay | Admitting: Obstetrics and Gynecology

## 2021-05-11 DIAGNOSIS — O139 Gestational [pregnancy-induced] hypertension without significant proteinuria, unspecified trimester: Secondary | ICD-10-CM | POA: Insufficient documentation

## 2021-05-11 HISTORY — DX: Gestational (pregnancy-induced) hypertension without significant proteinuria, unspecified trimester: O13.9

## 2021-05-11 NOTE — Telephone Encounter (Signed)
Called patient regarding blood pressure. Received fax from [employer] stating BP reading on Wednesday 05/10/21 was 140/80 10:32 PM denies symptoms and 131/74 11:40 PM. Patient has not taken BP today. Advised patient to wait about 10 minutes and check BP. Patient will send mychart message. Patient also denies any symptoms today.  Clovis Pu, RN

## 2021-05-12 ENCOUNTER — Other Ambulatory Visit: Payer: Self-pay

## 2021-05-12 ENCOUNTER — Ambulatory Visit (INDEPENDENT_AMBULATORY_CARE_PROVIDER_SITE_OTHER): Payer: Medicaid Other

## 2021-05-12 VITALS — BP 135/84 | HR 99 | Temp 98.6°F | Wt 201.2 lb

## 2021-05-12 DIAGNOSIS — Z3400 Encounter for supervision of normal first pregnancy, unspecified trimester: Secondary | ICD-10-CM

## 2021-05-12 DIAGNOSIS — O139 Gestational [pregnancy-induced] hypertension without significant proteinuria, unspecified trimester: Secondary | ICD-10-CM

## 2021-05-12 DIAGNOSIS — Z3A34 34 weeks gestation of pregnancy: Secondary | ICD-10-CM

## 2021-05-12 NOTE — Progress Notes (Signed)
   LOW-RISK PREGNANCY OFFICE VISIT  Patient name: Danielle Baldwin MRN [id number]  Date of birth: June 12, 1991 Chief Complaint:   Routine Prenatal Visit  Subjective:   Danielle Baldwin is a 30 y.o. G1P0 female at [redacted]w[redacted]d with an Estimated Date of Delivery: 06/18/21 being seen today for ongoing management of a low-risk pregnancy aeb has Seborrheic dermatitis of scalp; Supervision of normal first pregnancy, antepartum; Anemia affecting pregnancy; and Gestational hypertension on their problem list.  Patient presents today with no complaints.  She denies HA, visual disturbances, RUQ pain, or SOB. Patient endorses fetal movement. Patient denies abdominal cramping or contractions.  Patient denies vaginal concerns including abnormal discharge, leaking of fluid, and bleeding.  She also denies pain or difficulty with urination. Contractions: Not present. Vag. Bleeding: None.  Movement: Present.  Reviewed past medical,surgical, social, obstetrical and family history as well as problem list, medications and allergies.  Objective   Vitals:   05/12/21 0954  BP: 135/84  Pulse: 99  Temp: 98.6 F (37 C)  Weight: 201 lb 3.2 oz (91.3 kg)  Body mass index is 35.64 kg/m.  Total Weight Gain:36 lb 3.2 oz (16.4 kg)         Physical Examination:   General appearance: Well appearing, and in no distress  Mental status: Alert, oriented to person, place, and time  Skin: Warm & dry  Cardiovascular: Normal heart rate noted  Respiratory: Normal respiratory effort, no distress  Abdomen: Soft, gravid, nontender, AGA with Fundal Height: 36 cm  Pelvic: Cervical exam deferred           Extremities: Edema: Moderate pitting, indentation subsides rapidly  Fetal Status: Fetal Heart Rate (bpm): 159  Movement: Present   No results found for this or any previous visit (from the past 24 hour(s)).  Assessment & Plan:  Low-risk pregnancy of a 30 y.o., G1P0 at [redacted]w[redacted]d with an Estimated Date of Delivery: 06/18/21   1. Supervision of normal  first pregnancy, antepartum -Anticipatory guidance for upcoming appts. -Patient to schedule next appt in 2 weeks for an in-person visit. -Educated on GBS bacteria including what it is, why we test, and how and when we treat if needed.  2. Gestational hypertension, antepartum -Discussed recent diagnosis. -Informed that IOL is scheduled for Monday Sept 26th at [facility]. -Reviewed concerns with induction. -Informed that d/t diagnosis she is no longer candidate for WB. -Informed of need for weekly BPP until delivery. -Scheduled for Sept 15th  3. [redacted] weeks gestation of pregnancy -Doing well overall.      Meds: No orders of the defined types were placed in this encounter.  Labs/procedures today:  Lab Orders  No laboratory test(s) ordered today     Reviewed: Preterm labor symptoms and general obstetric precautions including but not limited to vaginal bleeding, contractions, leaking of fluid and fetal movement were reviewed in detail with the patient.  All questions were answered.  Follow-up: Return in about 1 week (around 05/19/2021) for LROB.  Orders Placed This Encounter  Procedures   [place] [facility] FETAL BPP WO NON STRESS   Cherre Robins MSN, CNM 05/12/2021

## 2021-05-13 NOTE — Addendum Note (Signed)
Addended by: Gerrit Heck L on: 05/13/2021 05:57 AM   Modules accepted: Orders, SmartSet

## 2021-05-15 ENCOUNTER — Encounter (HOSPITAL_COMMUNITY): Payer: Self-pay

## 2021-05-15 ENCOUNTER — Encounter (HOSPITAL_COMMUNITY): Payer: Self-pay | Admitting: *Deleted

## 2021-05-15 ENCOUNTER — Telehealth (HOSPITAL_COMMUNITY): Payer: Self-pay | Admitting: *Deleted

## 2021-05-15 NOTE — Telephone Encounter (Signed)
Preadmission screen  

## 2021-05-17 ENCOUNTER — Ambulatory Visit (INDEPENDENT_AMBULATORY_CARE_PROVIDER_SITE_OTHER): Payer: BC Managed Care – PPO

## 2021-05-17 ENCOUNTER — Ambulatory Visit: Payer: Medicaid Other

## 2021-05-17 ENCOUNTER — Other Ambulatory Visit: Payer: Self-pay

## 2021-05-17 VITALS — BP 141/91 | HR 99 | Wt 203.8 lb

## 2021-05-17 DIAGNOSIS — Z3400 Encounter for supervision of normal first pregnancy, unspecified trimester: Secondary | ICD-10-CM

## 2021-05-17 DIAGNOSIS — O133 Gestational [pregnancy-induced] hypertension without significant proteinuria, third trimester: Secondary | ICD-10-CM

## 2021-05-17 DIAGNOSIS — O99013 Anemia complicating pregnancy, third trimester: Secondary | ICD-10-CM

## 2021-05-17 DIAGNOSIS — Z3A35 35 weeks gestation of pregnancy: Secondary | ICD-10-CM | POA: Diagnosis not present

## 2021-05-17 DIAGNOSIS — O139 Gestational [pregnancy-induced] hypertension without significant proteinuria, unspecified trimester: Secondary | ICD-10-CM

## 2021-05-17 NOTE — Progress Notes (Signed)
Patient presents for Nst and AFI for gestational hypertension. No other concerns today.  AFI 15.77

## 2021-05-18 ENCOUNTER — Other Ambulatory Visit: Payer: Medicaid Other

## 2021-05-18 ENCOUNTER — Other Ambulatory Visit (HOSPITAL_COMMUNITY): Payer: Self-pay | Admitting: Advanced Practice Midwife

## 2021-05-19 ENCOUNTER — Ambulatory Visit (INDEPENDENT_AMBULATORY_CARE_PROVIDER_SITE_OTHER): Payer: Medicaid Other | Admitting: Certified Nurse Midwife

## 2021-05-19 ENCOUNTER — Other Ambulatory Visit: Payer: Self-pay

## 2021-05-19 VITALS — BP 122/79 | HR 105 | Temp 98.4°F | Wt 202.6 lb

## 2021-05-19 DIAGNOSIS — O133 Gestational [pregnancy-induced] hypertension without significant proteinuria, third trimester: Secondary | ICD-10-CM

## 2021-05-19 DIAGNOSIS — Z3A35 35 weeks gestation of pregnancy: Secondary | ICD-10-CM

## 2021-05-19 DIAGNOSIS — Z3493 Encounter for supervision of normal pregnancy, unspecified, third trimester: Secondary | ICD-10-CM

## 2021-05-19 NOTE — Progress Notes (Signed)
Patient was assessed and managed by nursing staff during this encounter. I have reviewed the chart and agree with the documentation and plan. I have also made any necessary editorial changes.  Deaundra Dupriest A Junious Ragone, MD 05/19/2021 12:53 PM   

## 2021-05-19 NOTE — Progress Notes (Signed)
   PRENATAL VISIT NOTE  Subjective:  Danielle Baldwin is a 30 y.o. G1P0 at [redacted]w[redacted]d being seen today for ongoing prenatal care.  She is currently monitored for the following issues for this low-risk pregnancy and has Seborrheic dermatitis of scalp; Supervision of normal first pregnancy, antepartum; Anemia affecting pregnancy; and Gestational hypertension on their problem list.  Patient reports no complaints, denies headache, blurred vision, or epigastric pain. Expressed great concern at IOL scheduling prior to discussion of gestational hypertension diagnosis.  Contractions: Not present. Vag. Bleeding: None.  Movement: Present. Denies leaking of fluid.   The following portions of the patient's history were reviewed and updated as appropriate: allergies, current medications, past family history, past medical history, past social history, past surgical history and problem list.   Objective:   Vitals:   05/19/21 0942  BP: 122/79  Pulse: (!) 105  Temp: 98.4 F (36.9 C)  Weight: 202 lb 9.6 oz (91.9 kg)    Fetal Status: Fetal Heart Rate (bpm): 160   Movement: Present     General:  Alert, oriented and cooperative. Patient is in no acute distress.  Skin: Skin is warm and dry. No rash noted.   Cardiovascular: Normal heart rate noted  Respiratory: Normal respiratory effort, no problems with respiration noted  Abdomen: Soft, gravid, appropriate for gestational age.  Pain/Pressure: Absent     Pelvic: Cervical exam deferred        Extremities: Normal range of motion.  Edema: Trace  Mental Status: Normal mood and affect. Normal behavior. Normal judgment and thought content.   Assessment and Plan:  Pregnancy: G1P0 at [redacted]w[redacted]d 1. Supervision of low-risk pregnancy, third trimester - Doing well, feeling regular and vigorous fetal movement  2. [redacted] weeks gestation of pregnancy - Routine OB care including anticipatory guidance re GBS at next visit  3. Gestational hypertension, third trimester - Thorough  discussion on how gHTN is different from [place] and why IOL is recommended. Per Dr. Parke Poisson, pts with BPs consistently in the 130s/80s to 140s/90s with no symptoms can wait until 38wks for IOL. Pt prefers to wait until October, will continue monitoring BP and delay IOL until 38wks if possible.  Preterm labor symptoms and general obstetric precautions including but not limited to vaginal bleeding, contractions, leaking of fluid and fetal movement were reviewed in detail with the patient. Please refer to After Visit Summary for other counseling recommendations.   No follow-ups on file.  Future Appointments  Date Time Provider Department Center  05/24/2021 10:55 AM Bernerd Limbo, CNM [facility] None  05/29/2021 12:00 AM [facility] SCHED ROOM [facility] None    Bernerd Limbo, CNM

## 2021-05-24 ENCOUNTER — Ambulatory Visit (INDEPENDENT_AMBULATORY_CARE_PROVIDER_SITE_OTHER): Payer: BC Managed Care – PPO | Admitting: Certified Nurse Midwife

## 2021-05-24 ENCOUNTER — Other Ambulatory Visit (HOSPITAL_COMMUNITY)
Admission: RE | Admit: 2021-05-24 | Discharge: 2021-05-24 | Disposition: A | Discharge status: Home / Self Care | Payer: BC Managed Care – PPO | Source: Ambulatory Visit

## 2021-05-24 ENCOUNTER — Other Ambulatory Visit: Payer: Self-pay

## 2021-05-24 VITALS — BP 133/84 | HR 103 | Temp 98.5°F | Wt 201.6 lb

## 2021-05-24 DIAGNOSIS — B9689 Other specified bacterial agents as the cause of diseases classified elsewhere: Secondary | ICD-10-CM

## 2021-05-24 DIAGNOSIS — O139 Gestational [pregnancy-induced] hypertension without significant proteinuria, unspecified trimester: Secondary | ICD-10-CM

## 2021-05-24 DIAGNOSIS — Z118 Encounter for screening for other infectious and parasitic diseases: Secondary | ICD-10-CM | POA: Insufficient documentation

## 2021-05-24 DIAGNOSIS — Z3A36 36 weeks gestation of pregnancy: Secondary | ICD-10-CM

## 2021-05-24 DIAGNOSIS — Z3400 Encounter for supervision of normal first pregnancy, unspecified trimester: Secondary | ICD-10-CM | POA: Insufficient documentation

## 2021-05-24 DIAGNOSIS — O133 Gestational [pregnancy-induced] hypertension without significant proteinuria, third trimester: Secondary | ICD-10-CM | POA: Insufficient documentation

## 2021-05-24 DIAGNOSIS — N760 Acute vaginitis: Secondary | ICD-10-CM

## 2021-05-24 DIAGNOSIS — Z3403 Encounter for supervision of normal first pregnancy, third trimester: Secondary | ICD-10-CM

## 2021-05-24 MED ORDER — NUVESSA 1.3 % VA GEL
1.0000 | Freq: Once | VAGINAL | 0 refills | Status: AC
Start: 2021-05-24 — End: 2021-05-24

## 2021-05-25 LAB — CERVICOVAGINAL ANCILLARY ONLY
Bacterial Vaginitis (gardnerella): POSITIVE — AB
Candida Glabrata: NEGATIVE
Candida Vaginitis: NEGATIVE
Chlamydia: NEGATIVE
Comment: NEGATIVE
Comment: NEGATIVE
Comment: NEGATIVE
Comment: NEGATIVE
Comment: NEGATIVE
Comment: NORMAL
Neisseria Gonorrhea: NEGATIVE
Trichomonas: NEGATIVE

## 2021-05-26 NOTE — Progress Notes (Signed)
   PRENATAL VISIT NOTE  Subjective:  Danielle Baldwin is a 30 y.o. G1P0 at [redacted]w[redacted]d being seen today for ongoing prenatal care.  She is currently monitored for the following issues for this low-risk pregnancy and has Seborrheic dermatitis of scalp; Supervision of normal first pregnancy, antepartum; Anemia affecting pregnancy; and Gestational hypertension on their problem list.  Patient reports no complaints but does feel like the oral flagyl did not help her BV, says the pills never work for her.  Contractions: Not present. Vag. Bleeding: None.  Movement: Present. Denies leaking of fluid.   The following portions of the patient's history were reviewed and updated as appropriate: allergies, current medications, past family history, past medical history, past social history, past surgical history and problem list.   Objective:   Vitals:   05/24/21 1104  BP: 133/84  Pulse: (!) 103  Temp: 98.5 F (36.9 C)  Weight: 201 lb 9.6 oz (91.4 kg)    Fetal Status: Fetal Heart Rate (bpm): 158 Fundal Height: 38 cm Movement: Present     General:  Alert, oriented and cooperative. Patient is in no acute distress.  Skin: Skin is warm and dry. No rash noted.   Cardiovascular: Normal heart rate noted  Respiratory: Normal respiratory effort, no problems with respiration noted  Abdomen: Soft, gravid, appropriate for gestational age.  Pain/Pressure: Present     Pelvic: Cervical exam deferred        Extremities: Normal range of motion.  Edema: Trace  Mental Status: Normal mood and affect. Normal behavior. Normal judgment and thought content.   Assessment and Plan:  Pregnancy: G1P0 at [redacted]w[redacted]d 1. Encounter for supervision of normal first pregnancy in third trimester - Doing well, feeling regular and vigorous fetal movement - Culture, beta strep (group b only) - Cervicovaginal ancillary only( [place])  2. [redacted] weeks gestation of pregnancy - Routine OB care - Culture, beta strep (group b only) - Cervicovaginal  ancillary only( [place])  3. Gestational hypertension, antepartum - well controlled with dietary and lifestyle changes, plan for IOL at 38wks as long as BP stays <140/90 per discussion with MFM.  4. Bacterial vaginosis - metroNIDAZOLE (NUVESSA) 1.3 % GEL; Place 1 application vaginally once for 1 dose.  Dispense: 5 g; Refill: 0  Preterm labor symptoms and general obstetric precautions including but not limited to vaginal bleeding, contractions, leaking of fluid and fetal movement were reviewed in detail with the patient. Please refer to After Visit Summary for other counseling recommendations.   No follow-ups on file.  Future Appointments  Date Time Provider Department Center  05/31/2021 10:55 AM Bernerd Limbo, CNM [facility] None  06/05/2021  7:00 AM [facility] SCHED ROOM [facility] None    Bernerd Limbo, CNM

## 2021-05-27 ENCOUNTER — Other Ambulatory Visit: Payer: Self-pay | Admitting: Advanced Practice Midwife

## 2021-05-28 LAB — CULTURE, BETA STREP (GROUP B ONLY): Strep Gp B Culture: NEGATIVE

## 2021-05-29 ENCOUNTER — Inpatient Hospital Stay (HOSPITAL_COMMUNITY): Admission: AD | Admit: 2021-05-29 | Payer: BC Managed Care – PPO | Source: Home / Self Care | Admitting: Family Medicine

## 2021-05-29 ENCOUNTER — Inpatient Hospital Stay (HOSPITAL_COMMUNITY): Payer: BC Managed Care – PPO

## 2021-05-31 ENCOUNTER — Ambulatory Visit (INDEPENDENT_AMBULATORY_CARE_PROVIDER_SITE_OTHER): Payer: BC Managed Care – PPO | Admitting: Certified Nurse Midwife

## 2021-05-31 ENCOUNTER — Other Ambulatory Visit: Payer: Self-pay

## 2021-05-31 VITALS — BP 133/80 | HR 82 | Temp 97.5°F | Wt 206.4 lb

## 2021-05-31 DIAGNOSIS — Z3403 Encounter for supervision of normal first pregnancy, third trimester: Secondary | ICD-10-CM

## 2021-05-31 DIAGNOSIS — O133 Gestational [pregnancy-induced] hypertension without significant proteinuria, third trimester: Secondary | ICD-10-CM

## 2021-05-31 DIAGNOSIS — Z3A37 37 weeks gestation of pregnancy: Secondary | ICD-10-CM

## 2021-05-31 NOTE — Progress Notes (Signed)
   PRENATAL VISIT NOTE  Subjective:  Danielle Baldwin is a 30 y.o. G1P0 at [redacted]w[redacted]d being seen today for ongoing prenatal care.  She is currently monitored for the following issues for this low-risk pregnancy and has Seborrheic dermatitis of scalp; Supervision of normal first pregnancy, antepartum; Anemia affecting pregnancy; and Gestational hypertension on their problem list.  Patient reports no complaints.  Contractions: Not present. Vag. Bleeding: None.  Movement: Present. Denies leaking of fluid.   The following portions of the patient's history were reviewed and updated as appropriate: allergies, current medications, past family history, past medical history, past social history, past surgical history and problem list.   Objective:   Vitals:   05/31/21 1113 05/31/21 1150  BP: (!) 143/86 133/80  Pulse: 90 82  Temp: (!) 97.5 F (36.4 C)   Weight: 206 lb 6.4 oz (93.6 kg)     Fetal Status: Fetal Heart Rate (bpm): 152 Fundal Height: 38 cm Movement: Present  Presentation: Vertex  General:  Alert, oriented and cooperative. Patient is in no acute distress.  Skin: Skin is warm and dry. No rash noted.   Cardiovascular: Normal heart rate noted  Respiratory: Normal respiratory effort, no problems with respiration noted  Abdomen: Soft, gravid, appropriate for gestational age.  Pain/Pressure: Absent     Pelvic: Cervical exam deferred        Extremities: Normal range of motion.  Edema: None  Mental Status: Normal mood and affect. Normal behavior. Normal judgment and thought content.   Assessment and Plan:  Pregnancy: G1P0 at [redacted]w[redacted]d 1. Encounter for supervision of low-risk first pregnancy in third trimester - Doing well, feeling regular and vigorous fetal movement  2. [redacted] weeks gestation of pregnancy - Routine OB care  3. Gestational hypertension, third trimester - Stable without meds in 130s/80s, IOL scheduled for 06/05/21.  - Strict PEC precautions given, pt expressed understanding.  Term  labor symptoms and general obstetric precautions including but not limited to vaginal bleeding, contractions, leaking of fluid and fetal movement were reviewed in detail with the patient. Please refer to After Visit Summary for other counseling recommendations.   Return in about 6 weeks (around 07/12/2021) for PP.  Future Appointments  Date Time Provider Department Center  06/05/2021  7:00 AM [facility] SCHED ROOM [facility] None  07/12/2021 10:35 AM Bernerd Limbo, CNM [facility] None    Bernerd Limbo, CNM

## 2021-06-01 ENCOUNTER — Telehealth: Payer: Self-pay

## 2021-06-01 ENCOUNTER — Encounter: Payer: Medicaid Other | Admitting: Obstetrics and Gynecology

## 2021-06-01 MED ORDER — METRONIDAZOLE 500 MG PO TABS: 500.0000 mg | ORAL_TABLET | Freq: Two times a day (BID) | ORAL | 0 refills | Status: DC

## 2021-06-01 MED ORDER — TERCONAZOLE 0.4 % VA CREA: 1.0000 | TOPICAL_CREAM | Freq: Every day | VAGINAL | 0 refills | Status: DC

## 2021-06-01 NOTE — Telephone Encounter (Signed)
Patient is requesting another refill on Flagyl and cream for yeast,her symptoms have not clear at this time.  Provider will go ahead and retreat patient.

## 2021-06-05 ENCOUNTER — Inpatient Hospital Stay (HOSPITAL_COMMUNITY): Admission: AD | Admit: 2021-06-05 | Payer: BC Managed Care – PPO | Source: Home / Self Care | Admitting: Family Medicine

## 2021-06-05 ENCOUNTER — Inpatient Hospital Stay (HOSPITAL_COMMUNITY): Payer: BC Managed Care – PPO | Attending: Family Medicine

## 2021-06-05 NOTE — Progress Notes (Signed)
Patient stated she would not be coming for her induction and will contact her provider. She does not have power and needs to handle some things first.

## 2021-06-05 NOTE — Progress Notes (Signed)
Provider notified

## 2021-06-08 ENCOUNTER — Other Ambulatory Visit: Payer: Self-pay

## 2021-06-08 ENCOUNTER — Encounter (HOSPITAL_COMMUNITY): Payer: Self-pay | Admitting: Family Medicine

## 2021-06-08 ENCOUNTER — Inpatient Hospital Stay (HOSPITAL_COMMUNITY)
Admission: AD | Admit: 2021-06-08 | Discharge: 2021-06-10 | DRG: 807 | Disposition: A | Discharge status: Home / Self Care | Payer: Medicaid Other | Attending: Family Medicine | Admitting: Family Medicine

## 2021-06-08 ENCOUNTER — Telehealth: Payer: Self-pay | Admitting: Certified Nurse Midwife

## 2021-06-08 DIAGNOSIS — Z3A38 38 weeks gestation of pregnancy: Secondary | ICD-10-CM

## 2021-06-08 DIAGNOSIS — Z87891 Personal history of nicotine dependence: Secondary | ICD-10-CM | POA: Diagnosis not present

## 2021-06-08 DIAGNOSIS — Z20822 Contact with and (suspected) exposure to covid-19: Secondary | ICD-10-CM | POA: Diagnosis present

## 2021-06-08 DIAGNOSIS — O134 Gestational [pregnancy-induced] hypertension without significant proteinuria, complicating childbirth: Principal | ICD-10-CM | POA: Diagnosis present

## 2021-06-08 DIAGNOSIS — Z880 Allergy status to penicillin: Secondary | ICD-10-CM

## 2021-06-08 LAB — COMPREHENSIVE METABOLIC PANEL
ALT: 16 U/L (ref 0–44)
AST: 21 U/L (ref 15–41)
Albumin: 3 g/dL — ABNORMAL LOW (ref 3.5–5.0)
Alkaline Phosphatase: 199 U/L — ABNORMAL HIGH (ref 38–126)
Anion gap: 7 (ref 5–15)
BUN: 5 mg/dL — ABNORMAL LOW (ref 6–20)
CO2: 23 mmol/L (ref 22–32)
Calcium: 8.7 mg/dL — ABNORMAL LOW (ref 8.9–10.3)
Chloride: 105 mmol/L (ref 98–111)
Creatinine, Ser: 0.61 mg/dL (ref 0.44–1.00)
GFR, Estimated: >60 mL/min (ref >60)
Glucose, Bld: 80 mg/dL (ref 70–99)
Potassium: 3.6 mmol/L (ref 3.5–5.1)
Sodium: 135 mmol/L (ref 135–145)
Total Bilirubin: 0.6 mg/dL (ref 0.3–1.2)
Total Protein: 6.5 g/dL (ref 6.5–8.1)

## 2021-06-08 LAB — TYPE AND SCREEN
ABO/RH(D): B POS
Antibody Screen: NEGATIVE

## 2021-06-08 LAB — CBC
HCT: 35 % — ABNORMAL LOW (ref 36.0–46.0)
Hemoglobin: 11.9 g/dL — ABNORMAL LOW (ref 12.0–15.0)
MCH: 31.5 pg (ref 26.0–34.0)
MCHC: 34 g/dL (ref 30.0–36.0)
MCV: 92.6 fL (ref 80.0–100.0)
Platelets: 184 10*3/uL (ref 150–400)
RBC: 3.78 MIL/uL — ABNORMAL LOW (ref 3.87–5.11)
RDW: 13.1 % (ref 11.5–15.5)
WBC: 7.8 10*3/uL (ref 4.0–10.5)
nRBC: 0 % (ref 0.0–0.2)

## 2021-06-08 LAB — PROTEIN / CREATININE RATIO, URINE
Creatinine, Urine: 45.56 mg/dL
Protein Creatinine Ratio: 0.18 mg/mg{Cre} — ABNORMAL HIGH (ref 0.00–0.15)
Total Protein, Urine: 8 mg/dL

## 2021-06-08 MED ORDER — LIDOCAINE HCL (PF) 1 % IJ SOLN
30.0000 mL | INTRAMUSCULAR | Status: DC | PRN
  Filled 2021-06-08: qty 30

## 2021-06-08 MED ORDER — OXYTOCIN-SODIUM CHLORIDE 30-0.9 UT/500ML-% IV SOLN: 2.5000 [IU]/h | INTRAVENOUS | Status: DC

## 2021-06-08 MED ORDER — MISOPROSTOL 25 MCG QUARTER TABLET: 25.0000 ug | ORAL_TABLET | ORAL | Status: DC | PRN

## 2021-06-08 MED ORDER — ACETAMINOPHEN 325 MG PO TABS: 650.0000 mg | ORAL_TABLET | ORAL | Status: DC | PRN

## 2021-06-08 MED ORDER — SOD CITRATE-CITRIC ACID 500-334 MG/5ML PO SOLN: 30.0000 mL | ORAL | Status: DC | PRN

## 2021-06-08 MED ORDER — ONDANSETRON HCL 4 MG/2ML IJ SOLN: 4.0000 mg | Freq: Four times a day (QID) | INTRAMUSCULAR | Status: DC | PRN

## 2021-06-08 MED ORDER — OXYTOCIN BOLUS FROM INFUSION: 333.0000 mL | Freq: Once | INTRAVENOUS | Status: DC

## 2021-06-08 MED ORDER — LACTATED RINGERS IV SOLN: INTRAVENOUS | Status: DC

## 2021-06-08 MED ORDER — OXYTOCIN-SODIUM CHLORIDE 30-0.9 UT/500ML-% IV SOLN: 1.0000 m[IU]/min | INTRAVENOUS | Status: DC

## 2021-06-08 MED ORDER — TERBUTALINE SULFATE 1 MG/ML IJ SOLN: 0.2500 mg | Freq: Once | INTRAMUSCULAR | Status: DC | PRN

## 2021-06-08 MED ORDER — FENTANYL CITRATE (PF) 100 MCG/2ML IJ SOLN
50.0000 ug | INTRAMUSCULAR | Status: DC | PRN
Start: 2021-06-08 — End: 2021-06-09

## 2021-06-08 MED ORDER — LACTATED RINGERS IV SOLN
500.0000 mL | INTRAVENOUS | Status: DC | PRN
  Administered 2021-06-09: 500 mL via INTRAVENOUS

## 2021-06-08 NOTE — Telephone Encounter (Signed)
Ms. Mui did not show up for her 06/05/21 IOL, reported to charge RN that her power was out and she needed to handle some things before coming in. Did not have follow up scheduled until her postpartum visit. Called pt to encourage her to come in for IOL, pt reported that she is having contractions that are getting stronger. Also noted increased BP and that her mother has been encouraging her to come in as well. Strongly advised pt to come to L&D now as her increased BP (150-160s/100s) could indicate preeclampsia and a need for magnesium to prevent seizures. Reassured patient that I will be here until 8pm and available to help get her settled and her IOL started. Pt amenable and will head to the hospital now.  Edd Arbour, CNM, MSN, IBCLC Certified Nurse Midwife, [facility]

## 2021-06-09 ENCOUNTER — Encounter (HOSPITAL_COMMUNITY): Payer: Self-pay | Admitting: Family Medicine

## 2021-06-09 DIAGNOSIS — Z3A38 38 weeks gestation of pregnancy: Secondary | ICD-10-CM

## 2021-06-09 DIAGNOSIS — O134 Gestational [pregnancy-induced] hypertension without significant proteinuria, complicating childbirth: Secondary | ICD-10-CM

## 2021-06-09 LAB — RPR: RPR Ser Ql: NONREACTIVE

## 2021-06-09 LAB — RESP PANEL BY RT-PCR (FLU A&B, COVID) ARPGX2
Influenza A by PCR: NEGATIVE
Influenza B by PCR: NEGATIVE
SARS Coronavirus 2 by RT PCR: NEGATIVE

## 2021-06-09 MED ORDER — DIBUCAINE (PERIANAL) 1 % EX OINT: 1.0000 "application " | TOPICAL_OINTMENT | CUTANEOUS | Status: DC | PRN

## 2021-06-09 MED ORDER — ONDANSETRON HCL 4 MG/2ML IJ SOLN: 4.0000 mg | INTRAMUSCULAR | Status: DC | PRN

## 2021-06-09 MED ORDER — MEDROXYPROGESTERONE ACETATE 150 MG/ML IM SUSP: 150.0000 mg | INTRAMUSCULAR | Status: DC | PRN

## 2021-06-09 MED ORDER — FERROUS SULFATE 325 (65 FE) MG PO TABS
325.0000 mg | ORAL_TABLET | ORAL | Status: DC
  Administered 2021-06-09: 325 mg via ORAL
  Filled 2021-06-09: qty 1

## 2021-06-09 MED ORDER — PRENATAL MULTIVITAMIN CH
1.0000 | ORAL_TABLET | Freq: Every day | ORAL | Status: DC
  Filled 2021-06-09 (×2): qty 1

## 2021-06-09 MED ORDER — BENZOCAINE-MENTHOL 20-0.5 % EX AERO: 1.0000 "application " | INHALATION_SPRAY | CUTANEOUS | Status: DC | PRN

## 2021-06-09 MED ORDER — ONDANSETRON HCL 4 MG PO TABS: 4.0000 mg | ORAL_TABLET | ORAL | Status: DC | PRN

## 2021-06-09 MED ORDER — ACETAMINOPHEN 325 MG PO TABS: 650.0000 mg | ORAL_TABLET | ORAL | Status: DC | PRN

## 2021-06-09 MED ORDER — BISACODYL 10 MG RE SUPP: 10.0000 mg | Freq: Every day | RECTAL | Status: DC | PRN

## 2021-06-09 MED ORDER — TETANUS-DIPHTH-ACELL PERTUSSIS 5-2.5-18.5 LF-MCG/0.5 IM SUSY
0.5000 mL | PREFILLED_SYRINGE | Freq: Once | INTRAMUSCULAR | Status: DC
Start: 2021-06-10 — End: 2021-06-10

## 2021-06-09 MED ORDER — COCONUT OIL OIL: 1.0000 "application " | TOPICAL_OIL | Status: DC | PRN

## 2021-06-09 MED ORDER — NIFEDIPINE ER OSMOTIC RELEASE 30 MG PO TB24
30.0000 mg | ORAL_TABLET | Freq: Every day | ORAL | Status: DC
  Administered 2021-06-09 – 2021-06-10 (×2): 30 mg via ORAL
  Filled 2021-06-09 (×2): qty 1

## 2021-06-09 MED ORDER — DOCUSATE SODIUM 100 MG PO CAPS
100.0000 mg | ORAL_CAPSULE | Freq: Two times a day (BID) | ORAL | Status: DC
Start: 2021-06-09 — End: 2021-06-10
  Filled 2021-06-09 (×2): qty 1

## 2021-06-09 MED ORDER — METHYLERGONOVINE MALEATE 0.2 MG PO TABS: 0.2000 mg | ORAL_TABLET | ORAL | Status: DC | PRN

## 2021-06-09 MED ORDER — IBUPROFEN 600 MG PO TABS
600.0000 mg | ORAL_TABLET | Freq: Four times a day (QID) | ORAL | Status: DC
Start: 2021-06-09 — End: 2021-06-10
  Administered 2021-06-09 (×3): 600 mg via ORAL
  Filled 2021-06-09 (×5): qty 1

## 2021-06-09 MED ORDER — SIMETHICONE 80 MG PO CHEW
80.0000 mg | CHEWABLE_TABLET | ORAL | Status: DC | PRN
Start: 2021-06-09 — End: 2021-06-10

## 2021-06-09 MED ORDER — FLEET ENEMA 7-19 GM/118ML RE ENEM: 1.0000 | ENEMA | Freq: Every day | RECTAL | Status: DC | PRN

## 2021-06-09 MED ORDER — MEASLES, MUMPS & RUBELLA VAC IJ SOLR
0.5000 mL | Freq: Once | INTRAMUSCULAR | Status: DC
Start: 2021-06-10 — End: 2021-06-10

## 2021-06-09 MED ORDER — METHYLERGONOVINE MALEATE 0.2 MG/ML IJ SOLN: 0.2000 mg | INTRAMUSCULAR | Status: DC | PRN

## 2021-06-09 MED ORDER — DIPHENHYDRAMINE HCL 25 MG PO CAPS: 25.0000 mg | ORAL_CAPSULE | Freq: Four times a day (QID) | ORAL | Status: DC | PRN

## 2021-06-09 MED ORDER — WITCH HAZEL-GLYCERIN EX PADS: 1.0000 "application " | MEDICATED_PAD | CUTANEOUS | Status: DC | PRN

## 2021-06-09 NOTE — Progress Notes (Signed)
Patient Vitals for the past 4 hrs:  BP Temp Temp src Pulse  06/09/21 0008 132/68 -- -- 80  06/08/21 2300 (!) 142/97 -- -- 83  06/08/21 2159 (!) 150/100 97.9 F (36.6 C) Oral 91   Had SROM about 30 minutes ago w/clear fluid, cx 8-9/90/0 station. Some variable decels to 70-80, moderate variability.  Back to baseline w/posiiton changes.  Continue present mgt

## 2021-06-09 NOTE — Discharge Summary (Signed)
Postpartum Discharge Summary    Patient Name: Danielle Baldwin DOB: 1990-12-24 MRN: [id number]  Date of admission: 06/08/2021 Delivery date:06/09/2021  Delivering provider: Christin Fudge  Date of discharge: 06/10/2021  Admitting diagnosis: Indication for care or intervention in labor or delivery [O75.9] Intrauterine pregnancy: [redacted]w[redacted]d    Secondary diagnosis:  Active Problems:   Indication for care or intervention in labor or delivery  Additional problems: gestational hypertension    Discharge diagnosis: Term Pregnancy Delivered and Gestational Hypertension                                              Post partum procedures: None Augmentation: none Complications: None  Hospital course: Onset of Labor With Vaginal Delivery      30y.o. yo G1P0 at 379w5das admitted in Latent Labor on 06/08/2021. Patient had an uncomplicated labor course as follows:  Membrane Rupture Time/Date: 12:36 AM ,06/09/2021   Delivery Method:Vaginal, Spontaneous  Episiotomy: None  Lacerations:  1st degree;Perineal  Patient had an uncomplicated postpartum course.  She is ambulating, tolerating a regular diet, passing flatus, and urinating well. Patient is discharged home in stable condition on 06/10/21.  Newborn Data: Birth date:06/09/2021  Birth time:2:25 AM  Gender:Female  Living status:Living  Apgars:8 ,9  Weight:7 lb 6.3 oz (3.354 kg)   Magnesium Sulfate received: No BMZ received: No Rhophylac:N/A MMR:N/A T-DaP: declined Flu: declined Transfusion:No  Physical exam  Vitals:   06/09/21 1520 06/09/21 2032 06/09/21 2319 06/10/21 0500  BP: (!) 145/85 136/84 133/83 133/81  Pulse: 83 79 81 89  Resp: 18 18  18   Temp: 98.4 F (36.9 C) 99 F (37.2 C)  98.9 F (37.2 C)  TempSrc: Oral Oral  Oral  SpO2:  100%  100%  Weight:      Height:       General: alert, cooperative, and no distress Lochia: appropriate Uterine Fundus: firm Incision: N/A DVT Evaluation: No evidence of DVT seen on physical  exam. Labs: Lab Results  Component Value Date   WBC 7.8 06/08/2021   HGB 11.9 (L) 06/08/2021   HCT 35.0 (L) 06/08/2021   MCV 92.6 06/08/2021   PLT 184 06/08/2021   CMP Latest Ref Rng & Units 06/08/2021  Glucose 70 - 99 mg/dL 80  BUN 6 - 20 mg/dL 5(L)  Creatinine 0.44 - 1.00 mg/dL 0.61  Sodium 135 - 145 mmol/L 135  Potassium 3.5 - 5.1 mmol/L 3.6  Chloride 98 - 111 mmol/L 105  CO2 22 - 32 mmol/L 23  Calcium 8.9 - 10.3 mg/dL 8.7(L)  Total Protein 6.5 - 8.1 g/dL 6.5  Total Bilirubin 0.3 - 1.2 mg/dL 0.6  Alkaline Phos 38 - 126 U/L 199(H)  AST 15 - 41 U/L 21  ALT 0 - 44 U/L 16   Edinburgh Score: Edinburgh Postnatal Depression Scale Screening Tool 06/09/2021  I have been able to laugh and see the funny side of things. 0  I have looked forward with enjoyment to things. 0  I have blamed myself unnecessarily when things went wrong. 1  I have been anxious or worried for no good reason. 0  I have felt scared or panicky for no good reason. 0  Things have been getting on top of me. 0  I have been so unhappy that I have had difficulty sleeping. 0  I have felt sad or miserable. 0  I  have been so unhappy that I have been crying. 0  The thought of harming myself has occurred to me. 0  Edinburgh Postnatal Depression Scale Total 1     After visit meds:  Allergies as of 06/10/2021   No Known Allergies      Medication List     STOP taking these medications    ascorbic acid 500 MG tablet Commonly known as: VITAMIN C   ferrous sulfate 325 (65 FE) MG EC tablet Replaced by: ferrous sulfate 325 (65 FE) MG tablet   Gojji Weight Scale Misc   metroNIDAZOLE 500 MG tablet Commonly known as: FLAGYL   terconazole 0.4 % vaginal cream Commonly known as: TERAZOL 7       TAKE these medications    acetaminophen 325 MG tablet Commonly known as: Tylenol Take 2 tablets (650 mg total) by mouth every 4 (four) hours as needed (for pain scale < 4).   Blood Pressure Monitor Automat Devi 1  Device by Does not apply route daily. Automatic blood pressure cuff regular size. To monitor blood pressure regularly at home. ICD-10 code: O09.90   ferrous sulfate 325 (65 FE) MG tablet Take 1 tablet (325 mg total) by mouth every other day. Start taking on: June 11, 2021 Replaces: ferrous sulfate 325 (65 FE) MG EC tablet   ibuprofen 600 MG tablet Commonly known as: ADVIL Take 1 tablet (600 mg total) by mouth every 6 (six) hours.   NIFEdipine 30 MG 24 hr tablet Commonly known as: ADALAT CC Take 1 tablet (30 mg total) by mouth daily.   PRENATAL VITAMIN PO Take by mouth.         Discharge home in stable condition Infant Feeding: Breast Infant Disposition:home with mother Discharge instruction: per After Visit Summary and Postpartum booklet. Activity: Advance as tolerated. Pelvic rest for 6 weeks.  Diet: routine diet Future Appointments: Future Appointments  Date Time Provider [place]  07/12/2021 10:35 AM Gabriel Carina, CNM [facility] None   Follow up Visit:  Follow-up Information     [facility] on 07/12/2021.   Specialty: Obstetrics and Gynecology Why: as scheduled for postpartum follow up - 10:35am with Gaylan Gerold, CNM Contact information: [place] [phone]               Patient scheduled for postpartum appointment already, see above.  06/10/2021 Gabriel Carina, CNM

## 2021-06-09 NOTE — Plan of Care (Signed)
CP progressing  

## 2021-06-09 NOTE — Lactation Note (Signed)
This note was copied from a baby's chart. Lactation Consultation Note  Patient Name: Danielle Baldwin [id number] Date: 06/09/2021 Reason for consult: Initial assessment;1st time breastfeeding;Primapara;Term Age:28 hours  LC in to visit with P1 Mom of term baby.  Mom holding baby swaddled on her chest.  Recommended removing blanket and placing baby STS on her chest.  Baby showing subtle cues.  Assisted with breast massage and hand expression, colostrum easily expressed.  Baby positioned in football hold and baby easily latched deeply with signs of milk transfer.  Mom taught to compress her breast to increase milk transfer.  Mom encouraged to keep baby STS and offer breast with cues.  Mom aware of IP and OP lactation support.  Mom encouraged to call prn for assistance.  Maternal Data Has patient been taught Hand Expression?: Yes Does the patient have breastfeeding experience prior to this delivery?: No  Feeding Mother's Current Feeding Choice: Breast Milk  LATCH Score Latch: Grasps breast easily, tongue down, lips flanged, rhythmical sucking.  Audible Swallowing: Spontaneous and intermittent  Type of Nipple: Everted at rest and after stimulation  Comfort (Breast/Nipple): Soft / non-tender  Hold (Positioning): Assistance needed to correctly position infant at breast and maintain latch.  LATCH Score: 9  Interventions Interventions: Breast feeding basics reviewed;Assisted with latch;Skin to skin;Breast massage;Hand express;Breast compression;Adjust position;Support pillows;Position options;LC Services brochure  Consult Status Consult Status: Follow-up Date: 06/10/21 Follow-up type: In-patient    Danielle Baldwin 06/09/2021, 9:02 AM

## 2021-06-09 NOTE — H&P (Signed)
Zerah Shimada is a 30 y.o. female G1P0 with IUP at [redacted]w[redacted]d presenting for IOL for GHTN. PNCare at [employer] [facility]. Was scheduled to be induced 4 days ago, but preferred to labor spontaneously.  Ctx started last night, have gotten much stronger today.  Does not want augmentation if possible.   Prenatal History/Complications:  GHTN  Past Medical History: Past Medical History:  Diagnosis Date   Allergy    Pregnancy induced hypertension     Past Surgical History: History reviewed. No pertinent surgical history.  Obstetrical History: OB History     Gravida  1   Para      Term      Preterm      AB      Living         SAB      IAB      Ectopic      Multiple      Live Births              Social History: Social History   Socioeconomic History   Marital status: Single    Spouse name: Not on file   Number of children: Not on file   Years of education: Not on file   Highest education level: Some college, no degree  Occupational History   Not on file  Tobacco Use   Smoking status: Former    Types: Cigarettes    Quit date: 08/04/2015    Years since quitting: 5.8   Smokeless tobacco: Never  Vaping Use   Vaping Use: Never used  Substance and Sexual Activity   Alcohol use: No    Alcohol/week: 0.0 standard drinks   Drug use: No   Sexual activity: Yes    Birth control/protection: None  Other Topics Concern   Not on file  Social History Narrative   Not on file   Social Determinants of Health   Financial Resource Strain: Not on file  Food Insecurity: Not on file  Transportation Needs: Not on file  Physical Activity: Not on file  Stress: Not on file  Social Connections: Not on file    Family History: Family History  Problem Relation Age of Onset   Hypertension Mother    Hypertension Maternal Grandmother    Cancer Maternal Grandmother    Cancer Maternal Grandfather     Allergies: No Known Allergies  Medications Prior to Admission  Medication Sig  Dispense Refill Last Dose   ascorbic acid (VITAMIN C) 500 MG tablet Take 1 tablet (500 mg total) by mouth every other day. Take with iron pill 30 tablet 3 06/08/2021   Blood Pressure Monitoring (BLOOD PRESSURE MONITOR AUTOMAT) DEVI 1 Device by Does not apply route daily. Automatic blood pressure cuff regular size. To monitor blood pressure regularly at home. ICD-10 code: O09.90 1 each 0 06/08/2021   ferrous sulfate 325 (65 FE) MG EC tablet Take 1 tablet (325 mg total) by mouth every other day. 30 tablet 2 06/08/2021   Prenatal Vit-Fe Fumarate-FA (PRENATAL VITAMIN PO) Take by mouth.   06/08/2021   terconazole (TERAZOL 7) 0.4 % vaginal cream Place 1 applicator vaginally at bedtime. Place 1 applicator vaginally at bedtime for 7 days. 45 g 0 06/08/2021   metroNIDAZOLE (FLAGYL) 500 MG tablet Take 1 tablet (500 mg total) by mouth 2 (two) times daily. (Patient not taking: Reported on 06/08/2021) 14 tablet 0 Not Taking   Misc. Devices (GOJJI WEIGHT SCALE) MISC 1 Device by Does not apply route daily as needed. To  weight self daily as needed at home. ICD-10 code: O09.90 1 each 0         Review of Systems   Constitutional: Negative for fever and chills Eyes: Negative for visual disturbances Respiratory: Negative for shortness of breath, dyspnea Cardiovascular: Negative for chest pain or palpitations  Gastrointestinal: Negative for abdominal pain, vomiting, diarrhea and constipation.   Genitourinary: Negative for dysuria and urgency Musculoskeletal: Negative for back pain, joint pain, myalgias  Neurological: Negative for dizziness and headaches      Blood pressure 132/68, pulse 80, temperature 97.9 F (36.6 C), temperature source Oral, resp. rate 19, height 5\' 3"  (1.6 m), weight 93 kg, last menstrual period 09/11/2020. General appearance: alert, cooperative, and no distress Lungs: normal respiratory effort Heart: regular rate and rhythm Abdomen: soft, non-tender; bowel sounds normal Extremities:  Homans sign is negative, no sign of DVT DTR's 2+ Presentation: cephalic Fetal monitoring  Baseline: 150 bpm, Variability: Good {> 6 bpm), Accelerations: Reactive, and Decelerations: Absent Uterine activity  2-3 minutes  CXx 3/80/0  Nursing Staff Provider  Office Location  [facility] Dating  LMP  Language  English Anatomy 04-07-1986  Normal EIF>>F/U as indicated by MFM  Flu Vaccine  Declined 05/31/21 Genetic/Carrier Screen  NIPS: LR Boy  (don't disclose) AFP:  Negative  Horizon:Negative  TDaP Vaccine   Declined 05/31/21 Hgb A1C or  GTT Early  Third trimester Normal  Glucose, Fasting 65 - 91 mg/dL 88   Glucose, 1 hour 65 - 179 mg/dL 06/02/21   Glucose, 2 hour 65 - 152 mg/dL 010     COVID Vaccine Declined   LAB RESULTS   Rhogam  N/A Blood Type B/Positive/-- (03/29 0855)   Baby Feeding Plan Breast Antibody Negative (03/29 0855)  Contraception None Rubella 1.06 (03/29 0855) IMMUNE  Circumcision No RPR Non Reactive (03/29 0855)   Pediatrician  Info given HBsAg Negative (03/29 0855)   Support Person FOB HCVAb Negative  Prenatal Classes Info given HIV Non Reactive (03/29 0855)     BTL Consent N/A GBS   (For PCN allergy, check sensitivities)   VBAC Consent N/A Pap 12/08/20 - normal       BP Cuff Home Waterbirth  [ ]  Class [ ]  Consent [ ]  CNM visit  Weight scale Home Induction  [ ]  Orders Entered [ ] Foley Y/N    Prenatal labs: ABO, Rh: --/--/B POS (10/06 2015) Antibody: NEG (10/06 2015) Rubella: immune RPR: Non Reactive (07/28 0845)  HBsAg: Negative (03/29 0855)  HIV: Non Reactive (07/28 0845)  GBS: Negative/-- (09/21 1607)   Prenatal Transfer Tool  Maternal Diabetes: No Genetic Screening: Normal Maternal Ultrasounds/Referrals: Normal Fetal Ultrasounds or other Referrals:  None Maternal Substance Abuse:  No Significant Maternal Medications:  None Significant Maternal Lab Results: Group B Strep negative    Results for orders placed or performed during the hospital encounter of 06/08/21  (from the past 24 hour(s))  Type and screen   Collection Time: 06/08/21  8:15 PM  Result Value Ref Range   ABO/RH(D) B POS    Antibody Screen NEG    Sample Expiration      06/11/2021,2359 Performed at [facility], [address], [place], 08/08/21 08/08/21   CBC   Collection Time: 06/08/21  8:16 PM  Result Value Ref Range   WBC 7.8 4.0 - 10.5 K/uL   RBC 3.78 (L) 3.87 - 5.11 MIL/uL   Hemoglobin 11.9 (L) 12.0 - 15.0 g/dL   HCT [facility] (L) [address] - [place] %  MCV 92.6 80.0 - 100.0 fL   MCH 31.5 26.0 - 34.0 pg   MCHC 34.0 30.0 - 36.0 g/dL   RDW 16.1 09.6 - 04.5 %   Platelets 184 150 - 400 K/uL   nRBC 0.0 0.0 - 0.2 %  Comprehensive metabolic panel   Collection Time: 06/08/21  8:16 PM  Result Value Ref Range   Sodium 135 135 - 145 mmol/L   Potassium 3.6 3.5 - 5.1 mmol/L   Chloride 105 98 - 111 mmol/L   CO2 23 22 - 32 mmol/L   Glucose, Bld 80 70 - 99 mg/dL   BUN 5 (L) 6 - 20 mg/dL   Creatinine, Ser 4.09 0.44 - 1.00 mg/dL   Calcium 8.7 (L) 8.9 - 10.3 mg/dL   Total Protein 6.5 6.5 - 8.1 g/dL   Albumin 3.0 (L) 3.5 - 5.0 g/dL   AST 21 15 - 41 U/L   ALT 16 0 - 44 U/L   Alkaline Phosphatase 199 (H) 38 - 126 U/L   Total Bilirubin 0.6 0.3 - 1.2 mg/dL   GFR, Estimated >81 >19 mL/min   Anion gap 7 5 - 15  Protein / creatinine ratio, urine   Collection Time: 06/08/21  8:16 PM  Result Value Ref Range   Creatinine, Urine 45.56 mg/dL   Total Protein, Urine 8 mg/dL   Protein Creatinine Ratio 0.18 (H) 0.00 - 0.15 mg/mg[Cre]    Assessment: Vearl Hinesley is a 30 y.o. G1P0 with an IUP at [redacted]w[redacted]d presenting for IOL for GHTN, but already in early labor..  Plan: #Labor: wants expectant mgt #Pain:  Per request #FWB Cat 1   Jacklyn Shell 06/09/2021, 12:23 AM

## 2021-06-10 MED ORDER — IBUPROFEN 600 MG PO TABS: 600.0000 mg | ORAL_TABLET | Freq: Four times a day (QID) | ORAL | 0 refills | Status: DC

## 2021-06-10 MED ORDER — FERROUS SULFATE 325 (65 FE) MG PO TABS: 325.0000 mg | ORAL_TABLET | ORAL | 3 refills | Status: DC

## 2021-06-10 MED ORDER — NIFEDIPINE ER 30 MG PO TB24: 30.0000 mg | ORAL_TABLET | Freq: Every day | ORAL | 0 refills | Status: DC

## 2021-06-10 MED ORDER — ACETAMINOPHEN 325 MG PO TABS: 650.0000 mg | ORAL_TABLET | ORAL | 2 refills | Status: DC | PRN

## 2021-06-10 NOTE — Lactation Note (Signed)
This note was copied from a baby's chart. Lactation Consultation Note  Patient Name: Danielle Baldwin [id number] Date: 06/10/2021 Reason for consult: Follow-up assessment;Primapara;1st time breastfeeding;Early term 37-38.6wks Age:28 hours  LC in to visit with P1 Mom of ET infant.  Baby at 2.6% weight loss. Baby on the breast with a deep latch in cradle hold.  LC place pillow support under baby and reviewed signs of a good latch.   Encouraged continued feeding with cues with a goal of 8-12 feedings per 24 hrs.  Engorgement prevention and treatment reviewed. Mom aware of OP Lactation support and encouraged to call prn.   LATCH Score Latch: Grasps breast easily, tongue down, lips flanged, rhythmical sucking.  Audible Swallowing: Spontaneous and intermittent  Type of Nipple: Everted at rest and after stimulation  Comfort (Breast/Nipple): Soft / non-tender  Hold (Positioning): No assistance needed to correctly position infant at breast.  LATCH Score: 10   Interventions Interventions: Breast feeding basics reviewed;Breast massage;Skin to skin;Hand express;Support pillows;Education;LC Services brochure  Discharge Discharge Education: Engorgement and breast care  Consult Status Consult Status: Complete Date: 06/10/21 Follow-up type: Call as needed    Danielle Baldwin 06/10/2021, 2:19 PM

## 2021-06-20 ENCOUNTER — Telehealth (HOSPITAL_COMMUNITY): Payer: Self-pay

## 2021-06-20 NOTE — Telephone Encounter (Signed)
"  Doing great. Feeling wonderful. Having some cramping but other than that I'm great." RN reviewed postpartum cramping. Patient has no questions or concerns about her healing.  "He's doing great as well. He has had some gas but other than that he has been great. His cord fell off and I was concerned about that but it is looking better." RN reviewed cord care and what to report to the pediatrician. "He sleeps in a bassinet." RN reviewed ABC's of safe sleep with patient. Patient declines any questions or concerns about baby.  EPDS score is 0.  Marcelino Duster [facility] 06/20/2021,1412

## 2021-07-12 ENCOUNTER — Ambulatory Visit (INDEPENDENT_AMBULATORY_CARE_PROVIDER_SITE_OTHER): Payer: BC Managed Care – PPO | Admitting: Certified Nurse Midwife

## 2021-07-12 ENCOUNTER — Other Ambulatory Visit: Payer: Self-pay

## 2021-07-12 DIAGNOSIS — O165 Unspecified maternal hypertension, complicating the puerperium: Secondary | ICD-10-CM

## 2021-07-12 NOTE — Progress Notes (Signed)
Post Partum Visit Note  Danielle Baldwin is a 30 y.o. G96P1001 female who presents for a postpartum visit. She is 4 weeks postpartum following a normal spontaneous vaginal delivery.  I have fully reviewed the prenatal and intrapartum course. The delivery was at [redacted]w[redacted]d gestational weeks.  Anesthesia: none. Postpartum course has been uncomplicated. Baby is doing well. Baby is feeding by breast. Bleeding thin lochia (intermittent staining). Bowel function is normal. Bladder function is normal. Patient is not sexually active. Contraception method is condoms. Postpartum depression screening: negative. Denies headache, visual disturbances or epigastric pain.  Upstream - 07/14/21 1751       Pregnancy Intention Screening   Does the patient want to become pregnant in the next year? No    Does the patient's partner want to become pregnant in the next year? No    Would the patient like to discuss contraceptive options today? No      Contraception Wrap Up   Current Method Female Condom;FAM or LAM    End Method Female Condom;FAM or LAM    Contraception Counseling Provided Yes            The pregnancy intention screening data noted above was reviewed. Potential methods of contraception were discussed. The patient elected to proceed with Female Condom; FAM or LAM.   Edinburgh Postnatal Depression Scale - 07/12/21 1107       Edinburgh Postnatal Depression Scale:  In the Past 7 Days   I have been able to laugh and see the funny side of things. 0    I have looked forward with enjoyment to things. 0    I have blamed myself unnecessarily when things went wrong. 0    I have been anxious or worried for no good reason. 0    I have felt scared or panicky for no good reason. 0    Things have been getting on top of me. 0    I have been so unhappy that I have had difficulty sleeping. 0    I have felt sad or miserable. 0    I have been so unhappy that I have been crying. 0    The thought of harming myself has occurred  to me. 0    Edinburgh Postnatal Depression Scale Total 0            Health Maintenance Due  Topic Date Due   COVID-19 Vaccine (1) Never done   The following portions of the patient's history were reviewed and updated as appropriate: allergies, current medications, past family history, past medical history, past social history, past surgical history, and problem list.  Review of Systems Pertinent items noted in HPI and remainder of comprehensive ROS otherwise negative.  Objective:  BP (!) 141/86 (BP Location: Left Arm, Patient Position: Sitting, Cuff Size: Large)   Pulse 74   Temp 99 F (37.2 C) (Oral)   Wt 187 lb 12.8 oz (85.2 kg)   LMP 09/11/2020 (Exact Date)   Breastfeeding Yes   BMI 33.27 kg/m    General:  alert, cooperative, appears stated age, and no distress   Breasts:  normal  Lungs: Normal effort  Heart:  regular rate and rhythm  Abdomen: Soft, nontender    Wound N/A  GU exam:   declined       Assessment:  Postpartum care and examination of lactating mother  Postpartum hypertension - BP elevated today (not severe range) and patient asymptomatic. Will continue daily nifedipine for another two weeks with another  BP check. Refer to FM if still elevated. Plan:   Essential components of care per ACOG recommendations:  1.  Mood and well being: Patient with negative depression screening today. Reviewed local resources for support.  - Patient tobacco use? No.   - hx of drug use? No.    2. Infant care and feeding:  -Patient currently breastmilk feeding? Yes. Discussed returning to work and pumping. Reviewed importance of draining breast regularly to support lactation.  -Social determinants of health (SDOH) reviewed in EPIC. No concerns   3. Sexuality, contraception and birth spacing - Patient does not want a pregnancy in the next year.  Desired family size is currently unknown, but is not done having children.  - Reviewed forms of contraception in tiered fashion.  Patient desired condoms, natural family planning (NFP) today.   - Discussed birth spacing of 18 months  4. Sleep and fatigue -Encouraged family/partner/community support of 4 hrs of uninterrupted sleep to help with mood and fatigue  5. Physical Recovery  - Discussed patients delivery and complications. She describes her labor as good. - Patient had a Vaginal, no problems at delivery. Patient had  no  laceration. Perineal healing reviewed. Patient expressed understanding - Patient has urinary incontinence? No. - Patient is not safe to resume physical and sexual activity due to her continued postpartum hypertension. Due to the nature of her job (very high stress), I am not clearing her for work until after she has been on BP meds another two weeks with follow up BP check. If still high we will refer to family medicine and clear her for work on hypertensive meds.  6.  Health Maintenance - HM due items addressed Yes - Last pap smear  Diagnosis  Date Value Ref Range Status  12/08/2020   Final   - Negative for intraepithelial lesion or malignancy (NILM)   Pap smear not done at today's visit.  -Breast Cancer screening indicated? No.   7. Chronic Disease/Pregnancy Condition follow up: None - PCP follow up as needed  Bernerd Limbo, CNM [facility] [employer], [facility]

## 2021-07-14 ENCOUNTER — Encounter: Payer: Self-pay | Admitting: Certified Nurse Midwife

## 2021-07-14 MED ORDER — NIFEDIPINE ER 30 MG PO TB24: 30.0000 mg | ORAL_TABLET | Freq: Every day | ORAL | 0 refills | Status: DC

## 2021-07-26 ENCOUNTER — Ambulatory Visit: Payer: BC Managed Care – PPO

## 2021-08-14 ENCOUNTER — Ambulatory Visit (INDEPENDENT_AMBULATORY_CARE_PROVIDER_SITE_OTHER): Payer: BC Managed Care – PPO | Admitting: *Deleted

## 2021-08-14 ENCOUNTER — Other Ambulatory Visit: Payer: Self-pay

## 2021-08-14 VITALS — BP 158/112 | HR 78 | Temp 98.0°F | Ht 63.0 in | Wt 188.8 lb

## 2021-08-14 DIAGNOSIS — I10 Essential (primary) hypertension: Secondary | ICD-10-CM

## 2021-08-14 MED ORDER — NIFEDIPINE ER OSMOTIC RELEASE 30 MG PO TB24
30.0000 mg | ORAL_TABLET | Freq: Every day | ORAL | 2 refills | Status: DC
Start: 2021-08-14 — End: 2021-10-02

## 2021-08-14 NOTE — Progress Notes (Signed)
   Subjective:  Danielle Baldwin is a 30 y.o. female here for BP check.   Hypertension ROS: not taking medications regularly as instructed, no TIA's, no chest pain on exertion, no dyspnea on exertion, no swelling of ankles, no orthostatic dizziness or lightheadedness, no orthopnea or paroxysmal nocturnal dyspnea, no palpitations, and no intermittent claudication symptoms.   Objective:  BP (!) 164/117 (BP Location: Left Arm, Patient Position: Sitting, Cuff Size: Large)   Pulse 76   Temp 98 F (36.7 C) (Oral)   Ht 5\' 3"  (1.6 m)   Wt 188 lb 12.8 oz (85.6 kg)   Breastfeeding Yes   BMI 33.44 kg/m   Appearance alert, well appearing, and in no distress, oriented to person, place, and time, and normal appearing weight. General exam BP noted to be well controlled today in office.   Today's Vitals   08/14/21 1534 08/14/21 1537 08/14/21 1545  BP: (!) 164/113 (!) 164/117 (!) 158/112  Pulse: 75 76 78  Temp: 98 F (36.7 C)    TempSrc: Oral    Weight: 188 lb 12.8 oz (85.6 kg)    Height: 5\' 3"  (1.6 m)       Assessment:   Blood Pressure needs further observation and needs improvement.   Plan:  Called [facility] spoke with 14/12/22, NP: Patient to restart Procardia 30 mg 24 hr tablet daily and schedule appointment with [facility] for Hypertension  Orders and follow up as documented in patient record. Reviewed medications and side effects in detail. , Judeth Horn, RN

## 2021-09-18 ENCOUNTER — Inpatient Hospital Stay (INDEPENDENT_AMBULATORY_CARE_PROVIDER_SITE_OTHER): Payer: BC Managed Care – PPO | Admitting: Primary Care

## 2021-10-02 ENCOUNTER — Ambulatory Visit (INDEPENDENT_AMBULATORY_CARE_PROVIDER_SITE_OTHER): Payer: Medicaid Other | Admitting: Primary Care

## 2021-10-02 ENCOUNTER — Encounter (INDEPENDENT_AMBULATORY_CARE_PROVIDER_SITE_OTHER): Payer: Self-pay | Admitting: Primary Care

## 2021-10-02 ENCOUNTER — Other Ambulatory Visit: Payer: Self-pay

## 2021-10-02 VITALS — BP 130/80 | HR 87 | Temp 98.0°F | Ht 63.0 in | Wt 193.0 lb

## 2021-10-02 DIAGNOSIS — I10 Essential (primary) hypertension: Secondary | ICD-10-CM | POA: Diagnosis not present

## 2021-10-02 DIAGNOSIS — R7303 Prediabetes: Secondary | ICD-10-CM

## 2021-10-02 DIAGNOSIS — Z7689 Persons encountering health services in other specified circumstances: Secondary | ICD-10-CM

## 2021-10-02 DIAGNOSIS — Z6834 Body mass index (BMI) 34.0-34.9, adult: Secondary | ICD-10-CM | POA: Diagnosis not present

## 2021-10-02 DIAGNOSIS — Z131 Encounter for screening for diabetes mellitus: Secondary | ICD-10-CM

## 2021-10-02 LAB — POCT GLYCOSYLATED HEMOGLOBIN (HGB A1C): Hemoglobin A1C: 5.9 % — AB (ref 4.0–5.6)

## 2021-10-02 MED ORDER — NIFEDIPINE ER OSMOTIC RELEASE 30 MG PO TB24: 30.0000 mg | ORAL_TABLET | Freq: Every day | ORAL | 1 refills | Status: DC

## 2021-10-02 NOTE — Patient Instructions (Signed)
Preventing Type 2 Diabetes Mellitus °Type 2 diabetes, also called type 2 diabetes mellitus, is a long-term (chronic) disease that affects sugar (glucose) levels in your blood. Normally, a hormone called insulin allows glucose to enter cells in your body. The cells use glucose for energy. With type 2 diabetes, you will have one or both of these problems: °Your pancreas does not make enough insulin. °Cells in your body do not respond properly to insulin that your body makes (insulin resistance). °Insulin resistance or lack of insulin causes extra glucose to build up in the blood instead of going into cells. As a result, high blood glucose (hyperglycemia) develops. That can cause many complications. Being overweight or obese and having an inactive (sedentary) lifestyle can increase your risk for diabetes. Type 2 diabetes can be delayed or prevented by making certain nutrition and lifestyle changes. °How can this condition affect me? °If you do not take steps to prevent diabetes, your blood glucose levels may keep increasing over time. Too much glucose in your blood for a long time can damage your blood vessels, heart, kidneys, nerves, and eyes. °Type 2 diabetes can lead to chronic health problems and complications, such as: °Heart disease. °Stroke. °Blindness. °Kidney disease. °Depression. °Poor circulation in your feet and legs. In severe cases, a foot or leg may need to be surgically removed (amputated). °What can increase my risk? °You may be more likely to develop type 2 diabetes if you: °Have type 2 diabetes in your family. °Are overweight or obese. °Have a sedentary lifestyle. °Have insulin resistance or a history of prediabetes. °Have a history of pregnancy-related (gestational) diabetes or polycystic ovary syndrome (PCOS). °What actions can I take to prevent this? °It can be difficult to recognize signs of type 2 diabetes. Taking action to prevent the disease before you develop symptoms is the best way to avoid  possible damage to your body. Making certain nutrition and lifestyle changes may prevent or delay the disease and related health problems. °Nutrition ° °Eat healthy meals and snacks regularly. Do not skip meals. Fruit or a handful of nuts is a healthy snack between meals. °Drink water throughout the day. Avoid drinks that contain added sugar, such as soda or sweetened tea. Drink enough fluid to keep your urine pale yellow. °Follow instructions from your health care provider about eating or drinking restrictions. °Limit the amount of food you eat by: °Managing how much you eat at a time (portion size). °Checking food labels for the serving sizes of food. °Using a kitchen scale to weigh amounts of food. °Sauté or steam food instead of frying it. Cook with water or broth instead of oils or butter. °Limit saturated fat and salt (sodium) in your diet. Have no more than 1 tsp (2,400 mg) of sodium a day. If you have heart disease or high blood pressure, use less than ½?¾ tsp (1,500 mg) of sodium a day. °Lifestyle ° °Lose weight if needed and as told. Your health care provider can determine how much weight loss is best for you and can help you lose weight safely. °If you are overweight or obese, you may be told to lose at least 5?7% of your body weight. °Manage blood pressure, cholesterol, and stress. Your health care provider will help determine the best treatment for you. °Do not use any products that contain nicotine or tobacco. These products include cigarettes, chewing tobacco, and vaping devices, such as e-cigarettes. If you need help quitting, ask your health care provider. °Activity ° °Do physical   activity that makes your heart beat faster and makes you sweat (moderate intensity). Do this for at least 30 minutes on at least 5 days of the week, or as much as told by your health care provider. °Ask your health care provider what activities are safe for you. A mix of activities may be best, such as walking, swimming,  cycling, and strength training. °Try to add physical activity into your day. For example: °Park your car farther away than usual so that you walk more. °Take a walk during your lunch break. °Use stairs instead of elevators or escalators. °Walk or bike to work instead of driving. °Alcohol use °If you drink alcohol: °Limit how much you have to: °0?1 drink a day for women who are not pregnant. °0?2 drinks a day for men. °Know how much alcohol is in your drink. In the [place] one drink equals one 12 oz bottle of beer (355 mL), one 5 oz glass of wine (148 mL), or one 1½ oz glass of hard liquor (44 mL). °General information °Talk with your health care provider about your risk factors and how you can reduce your risk for diabetes. °Have your blood glucose tested regularly, as told by your health care provider. °Get screening tests as told by your health care provider. You may have these regularly, especially if you have certain risk factors for type 2 diabetes. °Make an appointment with a registered dietitian. This diet and nutrition specialist can help you make a healthy eating plan and help you understand portion sizes and food labels. °Where to find support °Ask your health care provider to recommend a registered dietitian, a certified diabetes care and education specialist, or a weight loss program. °Look for local or online weight loss groups. °Join a gym, fitness club, or outdoor activity group, such as a walking club. °Where to find more information °For help and guidance and to learn more about diabetes and diabetes prevention, visit: °American Diabetes Association (ADA): www.diabetes.org °National Institute of Diabetes and Digestive and Kidney Diseases: www.niddk.nih.gov °To learn more about healthy eating, visit: °[place] Department of Agriculture (USDA): www.choosemyplate.gov °Office of Disease Prevention and Health Promotion (ODPHP): health.gov °Summary °You can delay or prevent type 2 diabetes by eating healthy  foods, losing weight if needed, and increasing your physical activity. °Talk with your health care provider about your risk factors for type 2 diabetes and how you can reduce your risk. °It can be difficult to recognize the signs of type 2 diabetes. The best way to avoid possible damage to your body is to take action to prevent the disease before you develop symptoms. °Get screening tests as told by your health care provider. °This information is not intended to replace advice given to you by your health care provider. Make sure you discuss any questions you have with your health care provider. °Document Revised: 11/14/2020 Document Reviewed: 11/14/2020 °Elsevier Patient Education © 2022 Elsevier Inc. ° °

## 2021-10-02 NOTE — Progress Notes (Signed)
[facility]   Subjective:   Danielle Baldwin is a 31 y.o. female presents for  establish care.She was asked to f/u with a PCP to manage Bp. Denies shortness of breath, headaches, chest pain or lower extremity edema. Feels pressure temporal to temporal like a rubber band- 4/10 - resolves on its own. Throughout the day denies triggers - such as  drinking alcohol smoking or certain medications, eating and drinking certain products-Caffeine, aged cheese, and chocolate. Does not take anything to relieve the pressure.   This condition may be triggered or caused by:  Past Medical History:  Diagnosis Date   Allergy    Gestational hypertension 05/11/2021   [facility] Guidelines for Antenatal Testing and Sonography                       Updated  11/21/2020 with Dr. 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 Automatic blood pressure cuff regular size. To monitor blood pressure regularly at home. ICD-10 code: O09.90 1 each 0   ferrous sulfate 325 (65 FE) MG tablet Take 1 tablet (325 mg total) by mouth every other day. (Patient not taking: Reported on 08/14/2021) 30 tablet 3   NIFEdipine (PROCARDIA-XL/NIFEDICAL-XL) 30 MG 24 hr tablet Take 1 tablet (30 mg total) by mouth daily. Can increase to twice a day as needed for symptomatic contractions (Patient not taking: Reported on 10/02/2021) 30 tablet 2   No current facility-administered medications on file prior to visit.     Review of  System: Comprehensive ROS Pertinent positive and negative noted in HPI    Objective:  BP 130/80 (BP Location: Right Arm, Patient Position: Sitting, Cuff Size: Normal)    Pulse 87    Temp 98 F (36.7 C) (Oral)    Ht 5\' 3"  (1.6 m)    Wt 193 lb (87.5 kg)    LMP 07/24/2021    SpO2 94%    Breastfeeding Yes    BMI 34.19 kg/m   Filed Weights   10/02/21 1449  Weight: 193 lb (87.5 kg)    Physical Exam: General Appearance: Well nourished, in no apparent distress. Eyes: PERRLA, EOMs, conjunctiva no swelling or erythema Sinuses: No Frontal/maxillary tenderness ENT/Mouth: Ext aud canals clear, TMs without erythema, bulging. No erythema, swelling, or exudate on post pharynx.  Tonsils not swollen or erythematous. Hearing normal.  Neck: Supple, thyroid normal.  Respiratory: Respiratory effort normal, BS equal bilaterally without rales, rhonchi, wheezing or stridor.  Cardio: RRR with no MRGs. Brisk peripheral pulses without edema.  Abdomen: Soft, + BS.  Non tender, no guarding, rebound, hernias, masses. Lymphatics: Non tender without lymphadenopathy.  Musculoskeletal: Full ROM, 5/5 strength, normal gait.  Skin: Warm, dry without rashes, lesions, ecchymosis.  Neuro: Cranial nerves intact. Normal muscle tone, no cerebellar symptoms. Sensation intact.  Psych: Awake and oriented X 3, normal  affect, Insight and Judgment appropriate.    Assessment:  Janeth was seen today for new patient (initial visit).  Diagnoses and all orders for this visit:  Screening for diabetes mellitus -     HgB A1c 5.9   Hypertension, unspecified type Counseled on blood pressure goal of less than 130/80, low-sodium, DASH diet, medication compliance, 150 minutes of moderate intensity exercise per week. Discussed medication compliance, adverse effects.   Encounter to establish care Establish care PCP  Prediabetes New diagnosis of prediabetes Monitor foods that are high in carbohydrates are the following rice, potatoes,  breads, sugars, and pastas.  Reduction in the intake (eating) will assist in lowering your blood sugars.    Class 2 severe obesity due to excess calories with serious comorbidity in adult, unspecified BMI ([place]) Counseled on blood pressure goal of less than 130/80, low-sodium, DASH diet, medication compliance, 150 minutes of moderate intensity exercise per week. Discussed medication compliance, adverse effects. ( She is still breast feeding)  This note has been created with Surveyor, quantity. Any transcriptional errors are unintentional.   Kerin Perna, NP 10/02/2021, 3:04 PM

## 2021-12-11 IMAGING — US US OB < 14 WEEKS - US OB TV
1 series · 15 of 28 positions shown · non-contrast
Comparison: None.

CLINICAL DATA: Initial evaluation for early pregnancy, evaluate
viability, unable to hear fetal heart tones.

EXAM:
OBSTETRIC <14 WK US AND TRANSVAGINAL OB US
TECHNIQUE: Both transabdominal and transvaginal ultrasound examinations were
performed for complete evaluation of the gestation as well as the
maternal uterus, adnexal regions, and pelvic cul-de-sac.
Transvaginal technique was performed to assess early pregnancy.

[Series 1: us ob < 14 weeks - us ob tv · 15 of 96 slices shown]
[im 1/96]
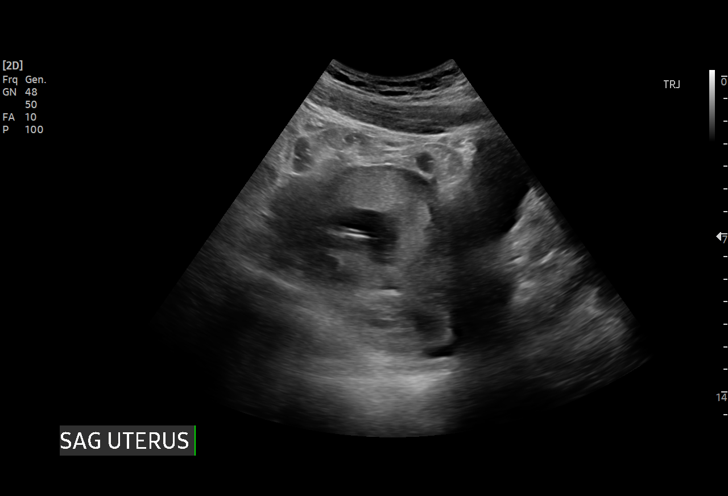
[im 8/96]
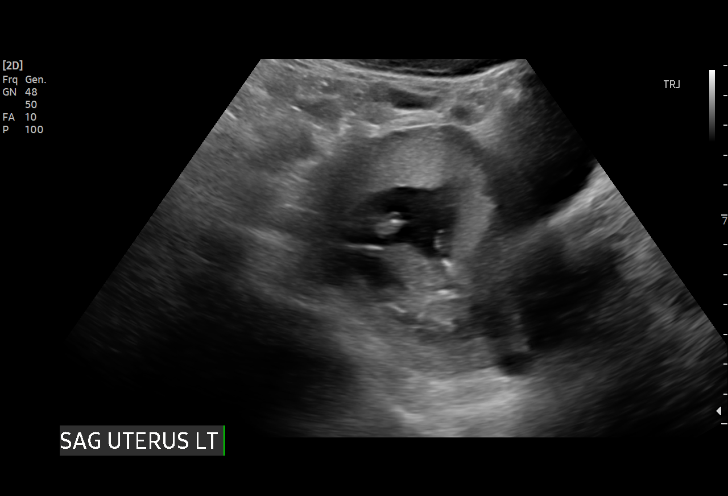
[im 15/96]
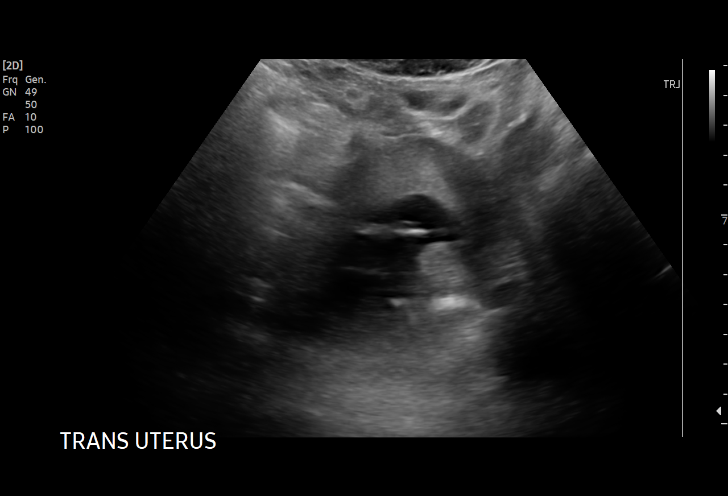
[im 22/96]
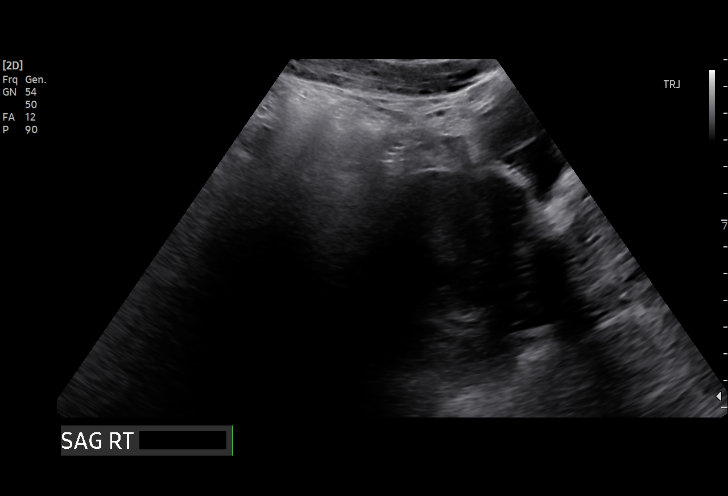
[im 29/96]
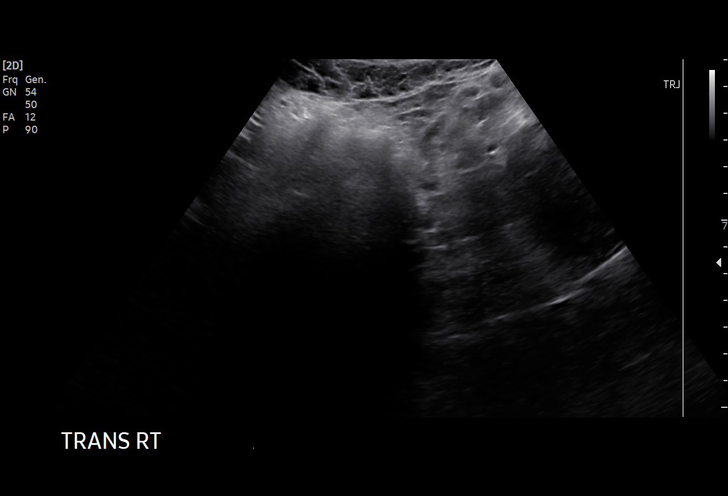
[im 36/96]
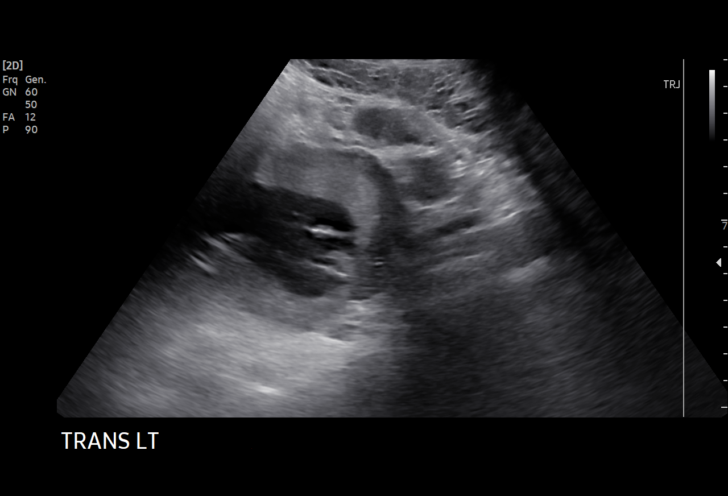
[im 43/96]
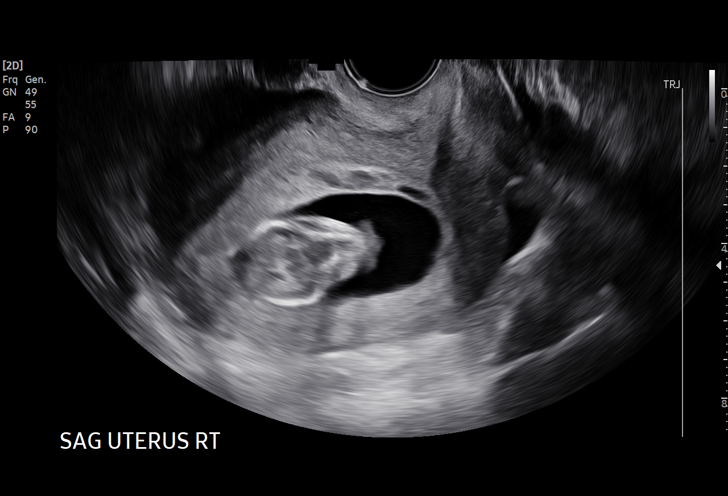
[im 50/96]
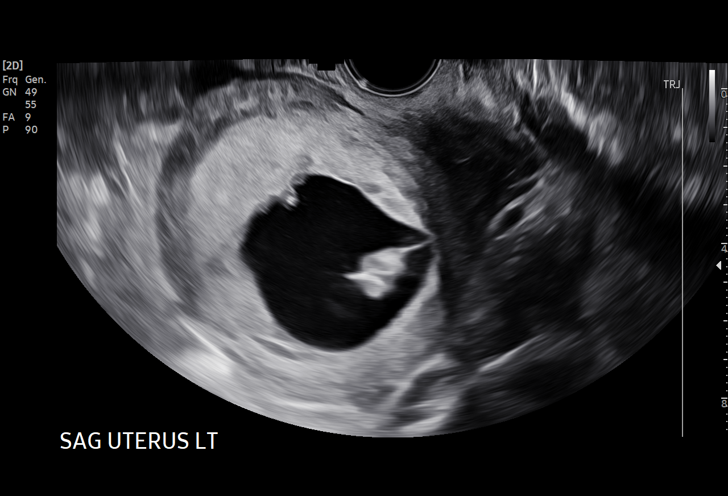
[im 53/96]
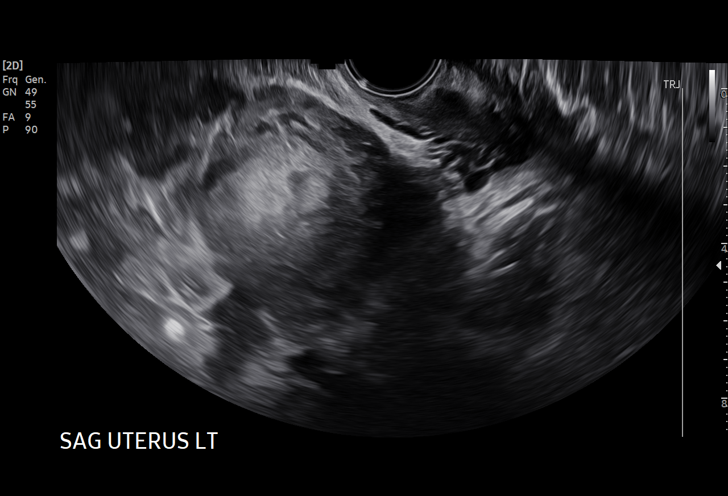
[im 60/96]
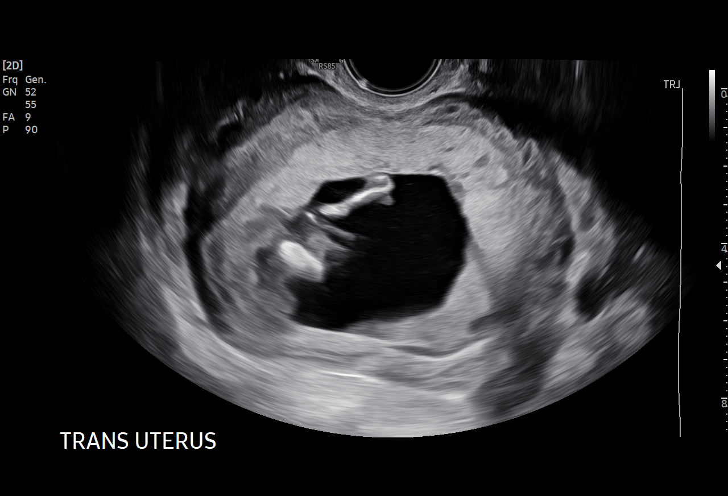
[im 67/96]
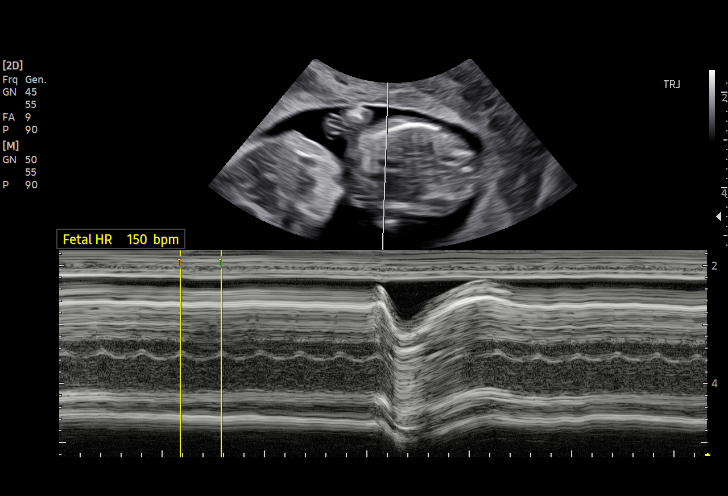
[im 74/96]
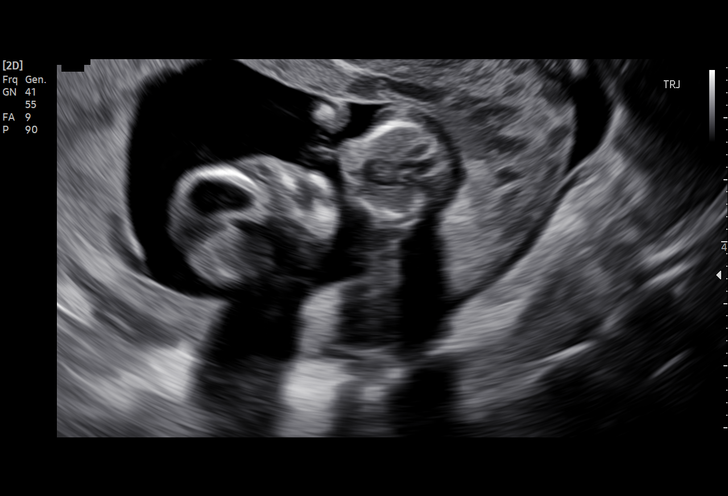
[im 81/96]
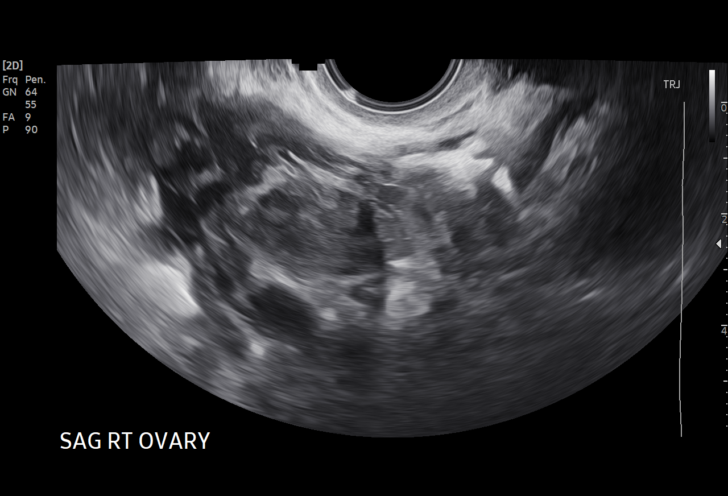
[im 88/96]
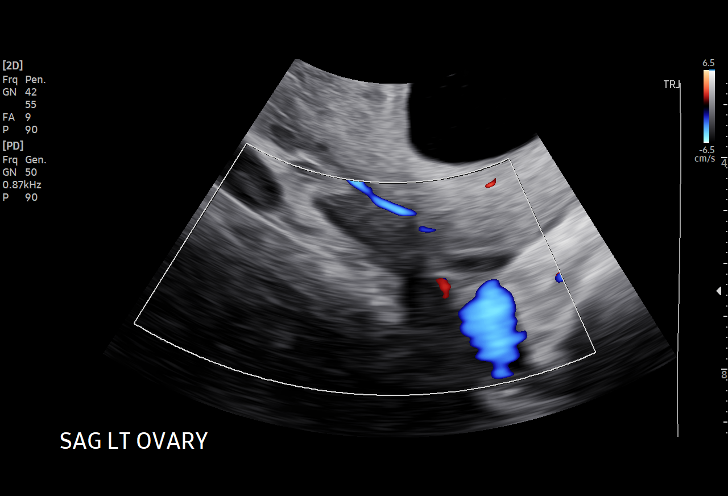
[im 96/96]
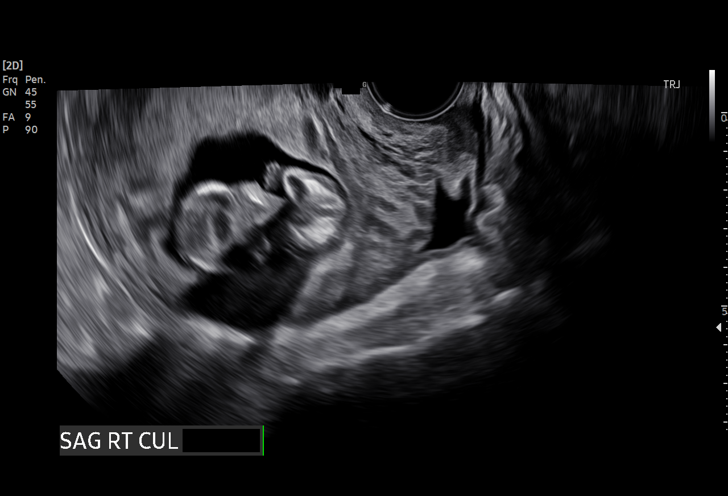

[15 of 28 positions shown; findings below may reference images not displayed]

FINDINGS: Intrauterine gestational sac: Single

Yolk sac:  Negative.

Embryo:  Present.

Cardiac Activity: Present.

Heart Rate: 154 bpm

CRL: 63.3 mm   12 w   5 d                  US EDC: 06/17/2021

Subchorionic hemorrhage:  None visualized.

Maternal uterus/adnexae: Ovaries are normal bilaterally. No adnexal
mass. Trace free fluid within the pelvis.
IMPRESSION: 1. Single viable intrauterine pregnancy as above, estimated
gestational age 12 weeks and 5 days by crown-rump length, with
ultrasound EDC of 06/17/2021. No complication.
2. No other acute maternal uterine or adnexal abnormality.

## 2022-01-03 ENCOUNTER — Ambulatory Visit (INDEPENDENT_AMBULATORY_CARE_PROVIDER_SITE_OTHER): Payer: Medicaid Other | Admitting: Primary Care

## 2022-01-03 ENCOUNTER — Encounter (INDEPENDENT_AMBULATORY_CARE_PROVIDER_SITE_OTHER): Payer: Self-pay | Admitting: Primary Care

## 2022-01-03 ENCOUNTER — Other Ambulatory Visit (HOSPITAL_COMMUNITY)
Admission: RE | Admit: 2022-01-03 | Discharge: 2022-01-03 | Disposition: A | Discharge status: Home / Self Care | Payer: Medicaid Other | Source: Ambulatory Visit | Attending: Primary Care | Admitting: Primary Care

## 2022-01-03 VITALS — BP 143/96 | HR 85 | Temp 98.0°F | Ht 63.0 in | Wt 192.8 lb

## 2022-01-03 DIAGNOSIS — N898 Other specified noninflammatory disorders of vagina: Secondary | ICD-10-CM

## 2022-01-03 DIAGNOSIS — I10 Essential (primary) hypertension: Secondary | ICD-10-CM | POA: Diagnosis not present

## 2022-01-03 MED ORDER — NIFEDIPINE ER OSMOTIC RELEASE 30 MG PO TB24: 30.0000 mg | ORAL_TABLET | Freq: Every day | ORAL | 1 refills | Status: DC

## 2022-01-03 NOTE — Patient Instructions (Signed)
Hypertension, Adult High blood pressure (hypertension) is when the force of blood pumping through the arteries is too strong. The arteries are the blood vessels that carry blood from the heart throughout the body. Hypertension forces the heart to work harder to pump blood and may cause arteries to become narrow or stiff. Untreated or uncontrolled hypertension can lead to a heart attack, heart failure, a stroke, kidney disease, and other problems. A blood pressure reading consists of a higher number over a lower number. Ideally, your blood pressure should be below 120/80. The first ("top") number is called the systolic pressure. It is a measure of the pressure in your arteries as your heart beats. The second ("bottom") number is called the diastolic pressure. It is a measure of the pressure in your arteries as the heart relaxes. What are the causes? The exact cause of this condition is not known. There are some conditions that result in high blood pressure. What increases the risk? Certain factors may make you more likely to develop high blood pressure. Some of these risk factors are under your control, including: Smoking. Not getting enough exercise or physical activity. Being overweight. Having too much fat, sugar, calories, or salt (sodium) in your diet. Drinking too much alcohol. Other risk factors include: Having a personal history of heart disease, diabetes, high cholesterol, or kidney disease. Stress. Having a family history of high blood pressure and high cholesterol. Having obstructive sleep apnea. Age. The risk increases with age. What are the signs or symptoms? High blood pressure may not cause symptoms. Very high blood pressure (hypertensive crisis) may cause: Headache. Fast or irregular heartbeats (palpitations). Shortness of breath. Nosebleed. Nausea and vomiting. Vision changes. Severe chest pain, dizziness, and seizures. How is this diagnosed? This condition is diagnosed by  measuring your blood pressure while you are seated, with your arm resting on a flat surface, your legs uncrossed, and your feet flat on the floor. The cuff of the blood pressure monitor will be placed directly against the skin of your upper arm at the level of your heart. Blood pressure should be measured at least twice using the same arm. Certain conditions can cause a difference in blood pressure between your right and left arms. If you have a high blood pressure reading during one visit or you have normal blood pressure with other risk factors, you may be asked to: Return on a different day to have your blood pressure checked again. Monitor your blood pressure at home for 1 week or longer. If you are diagnosed with hypertension, you may have other blood or imaging tests to help your health care provider understand your overall risk for other conditions. How is this treated? This condition is treated by making healthy lifestyle changes, such as eating healthy foods, exercising more, and reducing your alcohol intake. You may be referred for counseling on a healthy diet and physical activity. Your health care provider may prescribe medicine if lifestyle changes are not enough to get your blood pressure under control and if: Your systolic blood pressure is above 130. Your diastolic blood pressure is above 80. Your personal target blood pressure may vary depending on your medical conditions, your age, and other factors. Follow these instructions at home: Eating and drinking  Eat a diet that is high in fiber and potassium, and low in sodium, added sugar, and fat. An example of this eating plan is called the DASH diet. DASH stands for Dietary Approaches to Stop Hypertension. To eat this way: Eat   plenty of fresh fruits and vegetables. Try to fill one half of your plate at each meal with fruits and vegetables. Eat whole grains, such as whole-wheat pasta, brown rice, or whole-grain bread. Fill about one  fourth of your plate with whole grains. Eat or drink low-fat dairy products, such as skim milk or low-fat yogurt. Avoid fatty cuts of meat, processed or cured meats, and poultry with skin. Fill about one fourth of your plate with lean proteins, such as fish, chicken without skin, beans, eggs, or tofu. Avoid pre-made and processed foods. These tend to be higher in sodium, added sugar, and fat. Reduce your daily sodium intake. Many people with hypertension should eat less than 1,500 mg of sodium a day. Do not drink alcohol if: Your health care provider tells you not to drink. You are pregnant, may be pregnant, or are planning to become pregnant. If you drink alcohol: Limit how much you have to: 0-1 drink a day for women. 0-2 drinks a day for men. Know how much alcohol is in your drink. In the [place] one drink equals one 12 oz bottle of beer (355 mL), one 5 oz glass of wine (148 mL), or one 1 oz glass of hard liquor (44 mL). Lifestyle  Work with your health care provider to maintain a healthy body weight or to lose weight. Ask what an ideal weight is for you. Get at least 30 minutes of exercise that causes your heart to beat faster (aerobic exercise) most days of the week. Activities may include walking, swimming, or biking. Include exercise to strengthen your muscles (resistance exercise), such as Pilates or lifting weights, as part of your weekly exercise routine. Try to do these types of exercises for 30 minutes at least 3 days a week. Do not use any products that contain nicotine or tobacco. These products include cigarettes, chewing tobacco, and vaping devices, such as e-cigarettes. If you need help quitting, ask your health care provider. Monitor your blood pressure at home as told by your health care provider. Keep all follow-up visits. This is important. Medicines Take over-the-counter and prescription medicines only as told by your health care provider. Follow directions carefully. Blood  pressure medicines must be taken as prescribed. Do not skip doses of blood pressure medicine. Doing this puts you at risk for problems and can make the medicine less effective. Ask your health care provider about side effects or reactions to medicines that you should watch for. Contact a health care provider if you: Think you are having a reaction to a medicine you are taking. Have headaches that keep coming back (recurring). Feel dizzy. Have swelling in your ankles. Have trouble with your vision. Get help right away if you: Develop a severe headache or confusion. Have unusual weakness or numbness. Feel faint. Have severe pain in your chest or abdomen. Vomit repeatedly. Have trouble breathing. These symptoms may be an emergency. Get help right away. Call 911. Do not wait to see if the symptoms will go away. Do not drive yourself to the hospital. Summary Hypertension is when the force of blood pumping through your arteries is too strong. If this condition is not controlled, it may put you at risk for serious complications. Your personal target blood pressure may vary depending on your medical conditions, your age, and other factors. For most people, a normal blood pressure is less than 120/80. Hypertension is treated with lifestyle changes, medicines, or a combination of both. Lifestyle changes include losing weight, eating a healthy,   low-sodium diet, exercising more, and limiting alcohol. This information is not intended to replace advice given to you by your health care provider. Make sure you discuss any questions you have with your health care provider. Document Revised: 06/27/2021 Document Reviewed: 06/27/2021 Elsevier Patient Education  2023 [employer] Inc.  

## 2022-01-03 NOTE — Progress Notes (Signed)
? ? ?[place] ? ? ?Subjective:  ? Danielle Baldwin is a 31 y.o. G47P1001 female complains of an abnormal vaginal discharge for 2 days. Discharge described as: scant, thin, and odorless. Vaginal symptoms include local irritation and vulvar itching.Vulvar symptoms include local irritation and vulvar itching.STI Risk: Very low risk of STD exposure.   Other associated symptoms: none.Menstrual pattern: She had not had a menstrual cycle since she had her son 7 months ago. Breast feeding .Contraception: abstinence.  She denies vomiting, abdominal pain, fussiness, diarrhea, and difficulty breathing recent antibiotic exposure, reports  changes in soaps but detergents coinciding with the onset of her symptoms.  She has not previously self treated or been under treatment by another provider for these symptoms.  Denies shortness of breath, headaches, chest pain or lower extremity edema. ?  ?Gynecologic History ?No LMP recorded. ?Contraception: abstinence ? ?Obstetric History ?OB History  ?Gravida Para Term Preterm AB Living  ?1 1 1     1   ?SAB IAB Ectopic Multiple Live Births  ?      0 1  ?  ?# Outcome Date GA Lbr Len/2nd Weight Sex Delivery Anes PTL Lv  ?1 Term 06/09/21 [redacted]w[redacted]d 31:18 / 01:07 7 lb 6.3 oz (3.354 kg) M Vag-Spont None  LIV  ? ? ?Past Medical History:  ?Diagnosis Date  ? Allergy   ? Gestational hypertension 05/11/2021  ? [facility] Guidelines for Antenatal Testing and Sonography                       Updated  11/21/2020 with Dr. Tama High  INDICATION Growth U/S BPP weekly DELIVERY RECOMMENDATION (GA) Diabetes   A1 - good control     A2 - good control     A2  - poor control or poor compliance    (Macrosomia or polyhydramnios)     A2/B and B-C Pregestational Type II DM    Poor control B-C or D-R-F-T  or  Type I DM     ? Pregnancy induced hypertension   ? ? ?No past surgical history on file. ? ?Current Outpatient Medications on File Prior to Visit  ?Medication Sig Dispense Refill  ? NIFEdipine  (PROCARDIA-XL/NIFEDICAL-XL) 30 MG 24 hr tablet Take 1 tablet (30 mg total) by mouth daily. Can increase to twice a day as needed for symptomatic contractions 90 tablet 1  ? Blood Pressure Monitoring (BLOOD PRESSURE MONITOR AUTOMAT) DEVI 1 Device by Does not apply route daily. Automatic blood pressure cuff regular size. To monitor blood pressure regularly at home. ICD-10 code: O09.90 1 each 0  ? ferrous sulfate 325 (65 FE) MG tablet Take 1 tablet (325 mg total) by mouth every other day. (Patient not taking: Reported on 08/14/2021) 30 tablet 3  ? ?No current facility-administered medications on file prior to visit.  ? ? ?No Known Allergies ? ?Social History  ? ?Socioeconomic History  ? Marital status: Single  ?  Spouse name: Not on file  ? Number of children: Not on file  ? Years of education: Not on file  ? Highest education level: Some college, no degree  ?Occupational History  ? Not on file  ?Tobacco Use  ? Smoking status: Former  ?  Types: Cigarettes  ?  Quit date: 08/04/2015  ?  Years since quitting: 6.4  ? Smokeless tobacco: Never  ?Vaping Use  ? Vaping Use: Never used  ?Substance and Sexual Activity  ? Alcohol use: No  ?  Alcohol/week: 0.0 standard drinks  ?  Drug use: No  ? Sexual activity: Yes  ?  Birth control/protection: None  ?Other Topics Concern  ? Not on file  ?Social History Narrative  ? Not on file  ? ?Social Determinants of Health  ? ?Financial Resource Strain: Not on file  ?Food Insecurity: Not on file  ?Transportation Needs: Not on file  ?Physical Activity: Not on file  ?Stress: Not on file  ?Social Connections: Not on file  ?Intimate Partner Violence: Not on file  ? ? ?Family History  ?Problem Relation Age of Onset  ? Hypertension Mother   ? Hypertension Maternal Grandmother   ? Cancer Maternal Grandmother   ? Cancer Maternal Grandfather   ? ? ?The following portions of the patient's history were reviewed and updated as appropriate: allergies, current medications, past family history, past medical  history, past social history, past surgical history and problem list. ? ?Review of Systems ?Pertinent items noted in HPI and remainder of comprehensive ROS otherwise negative. ?  ?Objective:  ?BP (!) 143/96 (BP Location: Left Arm, Patient Position: Sitting, Cuff Size: Normal)   Pulse 85   Temp 98 ?F (36.7 ?C) (Oral)   Ht 5\' 3"  (1.6 m)   Wt 192 lb 12.8 oz (87.5 kg)   SpO2 95%   Breastfeeding Yes   BMI 34.15 kg/m?  ?CONSTITUTIONAL: Well-developed, well-nourished obese female in no acute distress.  ?HENT:  Normocephalic, atraumatic, External right and left ear normal. Oropharynx is clear and moist ?EYES: Conjunctivae and EOM are normal. Pupils are equal, round, and reactive to light. No scleral icterus.  ?NECK: Normal range of motion, supple, no masses.  Normal thyroid.  ?SKIN: Skin is warm and dry. No rash noted. Not diaphoretic. No erythema. No pallor. ?[place]: Alert and oriented to person, place, and time. Normal reflexes, muscle tone coordination.  ?PSYCHIATRIC: Normal mood and affect. Normal behavior. Normal judgment and thought content. ?CARDIOVASCULAR: Normal heart rate noted, regular rhythm ?RESPIRATORY: Clear to auscultation bilaterally. Effort and breath sounds normal, no problems with respiration noted. ?ABDOMEN: Soft, normal bowel sounds, no distention noted.  No tenderness, rebound or guarding.  ?MUSCULOSKELETAL: Normal range of motion. No tenderness.  No cyanosis, clubbing, or edema.  2+ distal pulses. ? ? ?Assessment:  ?Khiana was seen today for blood pressure check and vaginitis. ? ?Diagnoses and all orders for this visit: ? ?Vaginal discharge ?-     Cervicovaginal ancillary only ?Will follow up on all labs ordered today.  ?Patient counseled regarding condom use with each sexual activity to promote wellness and prevention of transmission of HIV, syphilis, herpes simplex virus, gonorrhea, chlamydia and trichomoniasis. ? ?Hypertension, unspecified type ?She is not taking Bp medication on a  regular bases. First the pharmacy had a delayed in sending medication out. Discussed future plans for her son in the event she was to have a stroke or a heart attack. States he has a village. Explained he needed his mother and needs to take Bp medication as soon as she eats breakfast. ?We have discussed target BP range and blood pressure goal. I have advised patient to check BP regularly and to call us back or report to clinic if the numbers are consistently higher than 140/90. We discussed the importance of compliance with medical therapy and DASH diet recommended, consequences of uncontrolled hypertension discussed.  ?- continue current BP medications  ? Sent in Procardia XL 30mg  daily to new pharmacy  ? ?This note has been created with Surveyor, quantity. Any transcriptional errors  are unintentional.  ? ?Kerin Perna, NP ?01/03/2022, 2:16 PM  ?

## 2022-01-05 ENCOUNTER — Ambulatory Visit (INDEPENDENT_AMBULATORY_CARE_PROVIDER_SITE_OTHER): Payer: Medicaid Other | Admitting: Primary Care

## 2022-01-08 LAB — CERVICOVAGINAL ANCILLARY ONLY
Bacterial Vaginitis (gardnerella): NEGATIVE
Candida Glabrata: NEGATIVE
Candida Vaginitis: NEGATIVE
Chlamydia: NEGATIVE
Comment: NEGATIVE
Comment: NEGATIVE
Comment: NEGATIVE
Comment: NEGATIVE
Comment: NEGATIVE
Comment: NORMAL
Neisseria Gonorrhea: NEGATIVE
Trichomonas: NEGATIVE

## 2022-01-09 ENCOUNTER — Telehealth: Payer: Self-pay | Admitting: Family Medicine

## 2022-01-09 NOTE — Telephone Encounter (Signed)
Patient called and has a couple of medical questions about her previous appt with [place] here. ?

## 2022-01-09 NOTE — Telephone Encounter (Signed)
I called patient back to inquire about what questions she had. Patient states she had completed a self swab and saw that it was negative for STDs and BV, however, patient inquired if she was negative for a yeast infection. I informed patient her self swab showed she was negative for a yeast infection. Patient verbalized understanding.  ? ?Alesia Richards, RN ?01/09/22 ?

## 2022-04-05 ENCOUNTER — Ambulatory Visit (INDEPENDENT_AMBULATORY_CARE_PROVIDER_SITE_OTHER): Payer: Medicaid Other | Admitting: Primary Care

## 2022-05-20 IMAGING — US US OB LIMITED
1 series · 6 of 6 positions shown · non-contrast
Comparison: none

[Series 1: us ob limited · 6 of 6 slices shown]
[im 1/6]
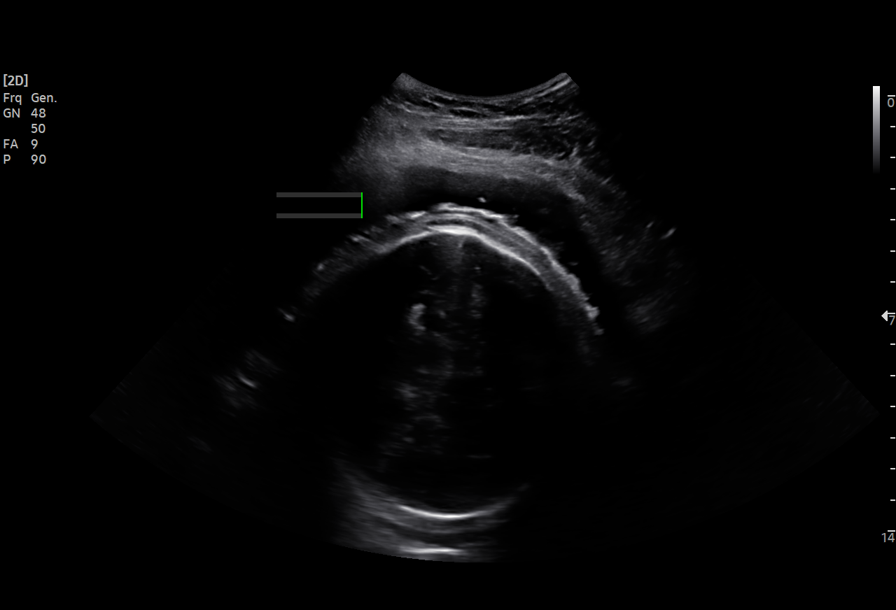
[im 2/6]
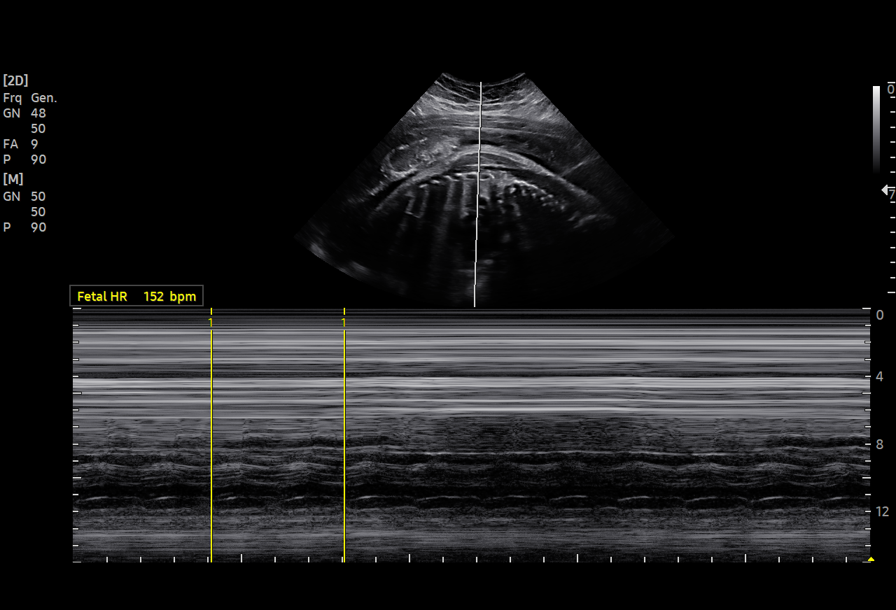
[im 3/6]
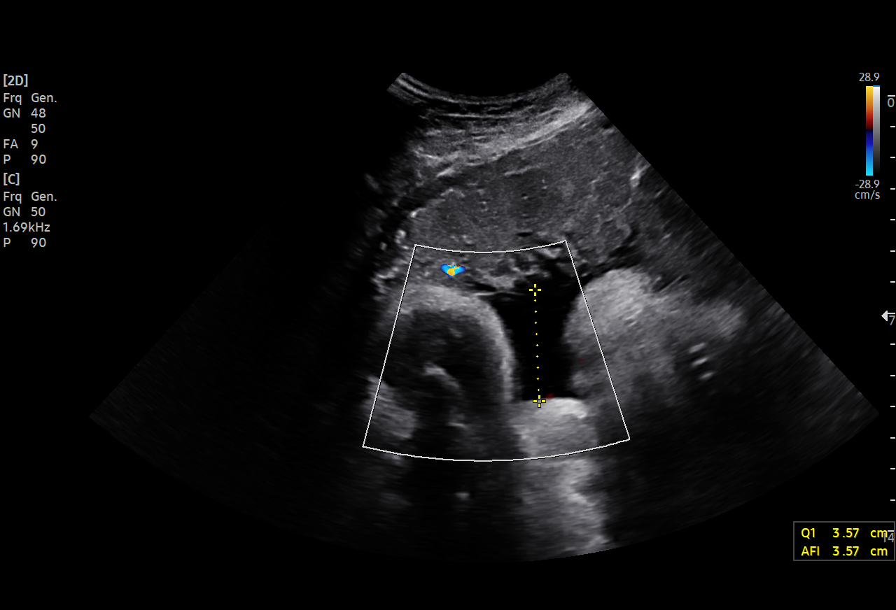
[im 4/6]
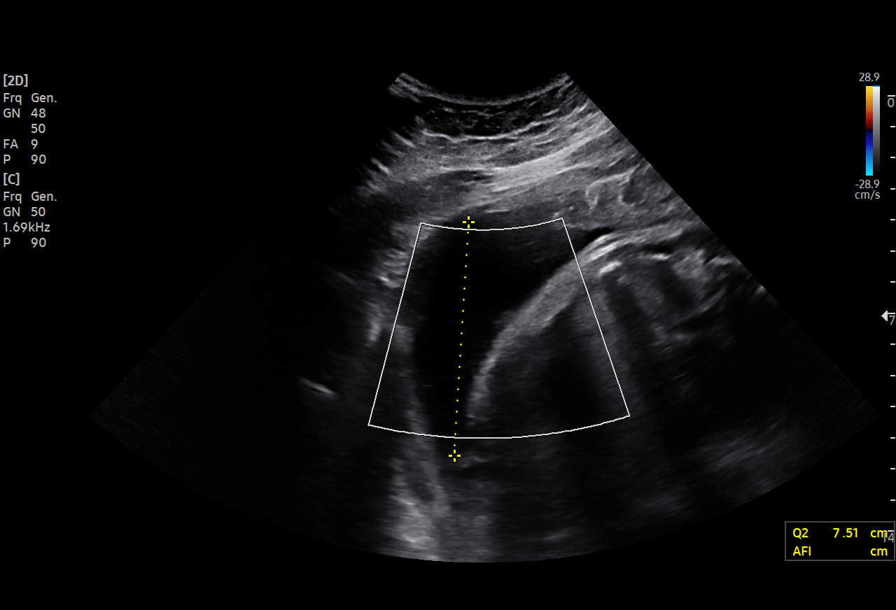
[im 5/6]
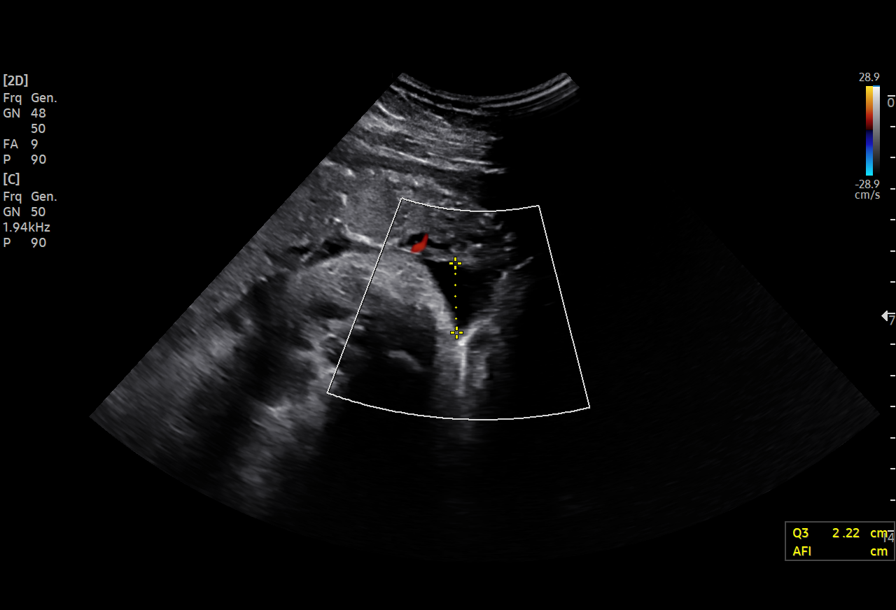
[im 6/6]
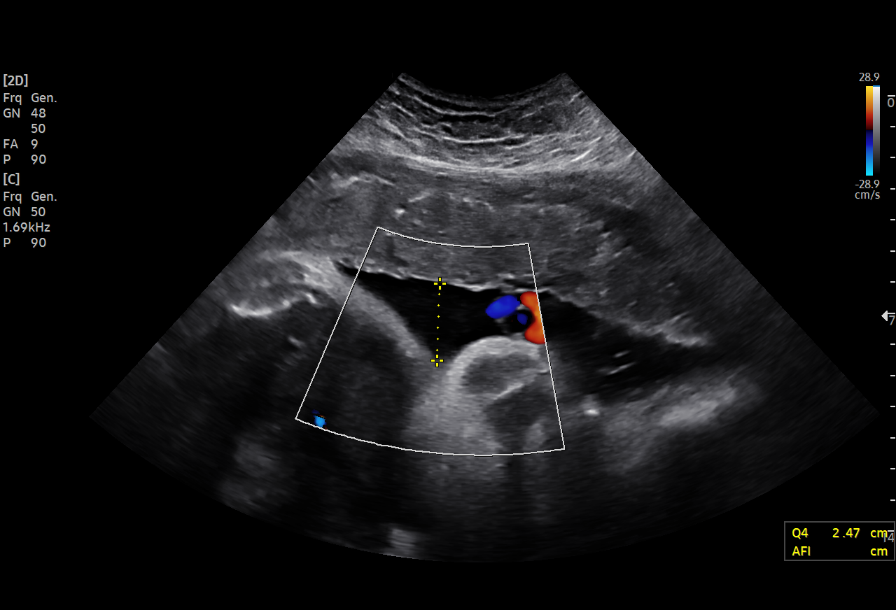

[6 of 6 positions shown; findings below may reference images not displayed]

Healthcare
                    CNM

 1   [HOSPITAL]                        [address].0      NICASIO KUMARI

Indications

 35 weeks gestation of pregnancy
Fetal Evaluation

 Num Of Fetuses:          1
 Fetal Heart Rate(bpm):   152
 Cardiac Activity:        Observed
 Presentation:            Cephalic

 AFI Sum(cm)     %Tile       Largest Pocket(cm)
 15.77           58

 RUQ(cm)       RLQ(cm)        LUQ(cm)        LLQ(cm)


 Comment:     AFI
OB History

 Gravidity:     1
Gestational Age

 LMP:            35w 3d       Date:  09/11/20                   EDD:  06/18/21
 Clinical EDD:   35w 3d                                         EDD:  06/18/21
 Best:           35w 3d    Det. By:  LMP  (09/11/20)            EDD:  06/18/21
Comments

 A limited ultrasound performed today shows that the fetus is in
 the vertex presentation.

 There was normal amniotic fluid noted.

## 2022-08-23 ENCOUNTER — Encounter: Payer: Self-pay | Admitting: Emergency Medicine

## 2022-08-23 ENCOUNTER — Ambulatory Visit
Admission: EM | Admit: 2022-08-23 | Discharge: 2022-08-23 | Disposition: A | Discharge status: Home / Self Care | Payer: Medicaid Other | Attending: Family Medicine | Admitting: Family Medicine

## 2022-08-23 DIAGNOSIS — M79674 Pain in right toe(s): Secondary | ICD-10-CM | POA: Diagnosis not present

## 2022-08-23 DIAGNOSIS — S90454A Superficial foreign body, right lesser toe(s), initial encounter: Secondary | ICD-10-CM | POA: Diagnosis not present

## 2022-08-23 DIAGNOSIS — I10 Essential (primary) hypertension: Secondary | ICD-10-CM | POA: Diagnosis not present

## 2022-08-23 NOTE — ED Triage Notes (Signed)
PT is present today with right foot pain. Pt states that she accidentally stepped on a piece of glass this morning.   Pt states that she also has concerns for HTN. Pt states that she had gestational HTN. Pt states that she started taking her blood pressure medication x2 weeks ago. PT states that she has been experiencing HAS and SOB.

## 2022-08-23 NOTE — Discharge Instructions (Addendum)
Your blood pressure was noted to be elevated during your visit today. If you are currently taking medication for high blood pressure, please ensure you are taking this as directed. If you do not have a history of high blood pressure and your blood pressure remains persistently elevated, you may need to begin taking a medication at some point. You may return here within the next few days to recheck if unable to see your primary care provider or if you do not have a one.  BP (!) 163/118   Pulse (!) 102   Temp 98.8 F (37.1 C)   Resp 18   SpO2 98%   Breastfeeding Yes   BP Readings from Last 3 Encounters:  08/23/22 (!) 163/118  01/03/22 (!) 143/96  10/02/21 130/80

## 2022-08-23 NOTE — ED Provider Notes (Addendum)
[facility]   [id number] 08/23/22 Arrival Time: 1030  ASSESSMENT & PLAN:  1. Toe pain, right   2. Foreign body of toe of right foot, initial encounter   3. Elevated blood pressure reading in office with diagnosis of hypertension    No s/s of HTN urgency. Has not taken BP med regularly. Agrees to start QD.  Glass sliver removed from lateral R 4th toe without difficulty. No incision required. No signs of infection.    Discharge Instructions      Your blood pressure was noted to be elevated during your visit today. If you are currently taking medication for high blood pressure, please ensure you are taking this as directed. If you do not have a history of high blood pressure and your blood pressure remains persistently elevated, you may need to begin taking a medication at some point. You may return here within the next few days to recheck if unable to see your primary care provider or if you do not have a one.  BP (!) 163/118   Pulse (!) 102   Temp 98.8 F (37.1 C)   Resp 18   SpO2 98%   Breastfeeding Yes   BP Readings from Last 3 Encounters:  08/23/22 (!) 163/118  01/03/22 (!) 143/96  10/02/21 130/80        Reviewed expectations re: course of current medical issues. Questions answered. Outlined signs and symptoms indicating need for more acute intervention. Patient verbalized understanding. After Visit Summary given.   SUBJECTIVE:  Danielle Baldwin is a 31 y.o. female who presents with concerns regarding increased blood pressures. She is treated for hypertension.  She reports not taking medications regularly as instructed, no medication side effects noted, no chest pain on exertion, no dyspnea on exertion, and no swelling of ankles.  Denies symptoms of chest pain, palpations, orthopnea, nocturnal dyspnea, or LE edema.  Social History   Tobacco Use  Smoking Status Former   Types: Cigarettes   Quit date: 08/04/2015   Years since quitting: 7.0  Smokeless  Tobacco Never     ROS: As per HPI.   OBJECTIVE:  Vitals:   08/23/22 1336  BP: (!) 163/118  Pulse: (!) 102  Resp: 18  Temp: 98.8 F (37.1 C)  SpO2: 98%    General appearance: alert; no distress Eyes: PERRLA; EOMI HENT: normocephalic; atraumatic Neck: supple Lungs: clear to auscultation bilaterally Heart: regular Extremities: no edema; symmetrical with no gross deformities Skin: warm and dry; apparent glass sliver in R lateral 4th toe Psychological: alert and cooperative; normal mood and affect   No Known Allergies  Past Medical History:  Diagnosis Date   Allergy    Gestational hypertension 05/11/2021   [facility] Guidelines for Antenatal Testing and Sonography                       Updated  11/21/2020 with Dr. 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    Poor control B-C or D-R-F-T  or  Type I DM      Pregnancy induced hypertension    Social History   Socioeconomic History   Marital status: Single    Spouse name: Not on file   Number  of children: Not on file   Years of education: Not on file   Highest education level: Some college, no degree  Occupational History   Not on file  Tobacco Use   Smoking status: Former    Types: Cigarettes    Quit date: 08/04/2015    Years since quitting: 7.0   Smokeless tobacco: Never  Vaping Use   Vaping Use: Never used  Substance and Sexual Activity   Alcohol use: No    Alcohol/week: 0.0 standard drinks of alcohol   Drug use: No   Sexual activity: Yes    Birth control/protection: None  Other Topics Concern   Not on file  Social History Narrative   Not on file   Social Determinants of Health   Financial Resource Strain: Not on file  Food Insecurity: Not on file  Transportation Needs: Not on file  Physical Activity: Not on  file  Stress: Not on file  Social Connections: Not on file  Intimate Partner Violence: Not on file   Family History  Problem Relation Age of Onset   Hypertension Mother    Hypertension Maternal Grandmother    Cancer Maternal Grandmother    Cancer Maternal Grandfather    History reviewed. No pertinent surgical history.     Mardella Layman, MD 08/23/22 [id number]    Mardella Layman, MD 08/23/22 [phone]

## 2022-08-29 ENCOUNTER — Encounter: Payer: Self-pay | Admitting: Podiatry

## 2022-08-29 ENCOUNTER — Ambulatory Visit (INDEPENDENT_AMBULATORY_CARE_PROVIDER_SITE_OTHER): Payer: Medicaid Other | Admitting: Podiatry

## 2022-08-29 ENCOUNTER — Other Ambulatory Visit: Payer: Self-pay | Admitting: Podiatry

## 2022-08-29 ENCOUNTER — Ambulatory Visit (INDEPENDENT_AMBULATORY_CARE_PROVIDER_SITE_OTHER): Payer: Medicaid Other

## 2022-08-29 DIAGNOSIS — M7751 Other enthesopathy of right foot: Secondary | ICD-10-CM

## 2022-08-29 DIAGNOSIS — M722 Plantar fascial fibromatosis: Secondary | ICD-10-CM

## 2022-08-29 DIAGNOSIS — M25571 Pain in right ankle and joints of right foot: Secondary | ICD-10-CM | POA: Diagnosis not present

## 2022-08-29 MED ORDER — TRIAMCINOLONE ACETONIDE 10 MG/ML IJ SUSP
10.0000 mg | Freq: Once | INTRAMUSCULAR | Status: AC
  Administered 2022-08-29: 10 mg

## 2022-08-29 NOTE — Progress Notes (Signed)
Subjective:   Patient ID: Danielle Baldwin, female   DOB: 31 y.o.   MRN: [id number]   HPI Patient presents with pain around the big toe joint right and mild instability of the ankle right stating that she has had sprains in the past but never severe.  States the big toe joint can be bothersome.  Patient does not smoke likes to be active   Review of Systems  All other systems reviewed and are negative.       Objective:  Physical Exam Vitals and nursing note reviewed.  Constitutional:      Appearance: She is well-developed.  Pulmonary:     Effort: Pulmonary effort is normal.  Musculoskeletal:        General: Normal range of motion.  Skin:    General: Skin is warm.  Neurological:     Mental Status: She is alert.     Neurovascular status intact muscle strength found to be adequate range of motion within normal limits.  Patient is noted to have inflammation fluid around the first MPJ right foot that is mildly painful when pressed and does have natural ligament laxity of the ankle bilateral but no indications of excessive this on the right side     Assessment:  Cannot rule out there is not arthritis of the big toe joint versus just generalized inflammation of the joint surface along with mild instability right ankle     Plan:  H&P x-rays reviewed today I went ahead did sterile prep and injected around the first MPJ 3 mg dexamethasone Kenalog 5 mg Xylocaine advised on rigid bottom shoes do not recommend treatment for the ankle but it developed severe ankle sprain want to see back  X-rays were negative for signs of arthritis of the first MPJ at this time appears to be soft tissue with no indications of ankle diastases or injury

## 2022-08-31 ENCOUNTER — Other Ambulatory Visit: Payer: Self-pay | Admitting: Podiatry

## 2022-08-31 DIAGNOSIS — M25571 Pain in right ankle and joints of right foot: Secondary | ICD-10-CM

## 2022-08-31 DIAGNOSIS — M7751 Other enthesopathy of right foot: Secondary | ICD-10-CM

## 2022-08-31 DIAGNOSIS — M722 Plantar fascial fibromatosis: Secondary | ICD-10-CM

## 2022-09-27 ENCOUNTER — Encounter (INDEPENDENT_AMBULATORY_CARE_PROVIDER_SITE_OTHER): Payer: Self-pay | Admitting: Primary Care

## 2022-09-27 ENCOUNTER — Ambulatory Visit (INDEPENDENT_AMBULATORY_CARE_PROVIDER_SITE_OTHER): Payer: Medicaid Other | Admitting: Primary Care

## 2022-09-27 VITALS — BP 121/78 | HR 99 | Resp 16 | Ht 63.0 in | Wt 178.8 lb

## 2022-09-27 DIAGNOSIS — L219 Seborrheic dermatitis, unspecified: Secondary | ICD-10-CM

## 2022-09-27 DIAGNOSIS — R21 Rash and other nonspecific skin eruption: Secondary | ICD-10-CM

## 2022-09-27 DIAGNOSIS — R7303 Prediabetes: Secondary | ICD-10-CM

## 2022-09-27 DIAGNOSIS — O24410 Gestational diabetes mellitus in pregnancy, diet controlled: Secondary | ICD-10-CM

## 2022-09-27 DIAGNOSIS — I10 Essential (primary) hypertension: Secondary | ICD-10-CM | POA: Diagnosis not present

## 2022-09-27 NOTE — Progress Notes (Signed)
[place]  Danielle Baldwin, is a 32 y.o. female  URK:[id number]  GBT:[id number]  DOB - Sep 27, 1990  Chief Complaint  Patient presents with   Blood Pressure Check   Form Completion    For emotional support animal        Subjective:   Danielle Baldwin is a 33 y.o. obese female ( lactating her 79 month old son) she  here today for a follow up on HTN. Patient has No headache, No chest pain, No abdominal pain - No Nausea, No new weakness tingling or numbness, No Cough - shortness of breath.  She voices concern about her head shading and all of a sudden her skin is breaking out with rashes and dark spots unclear if this is hormonal or not suggested referring to dermatologist and patient is in agreement  No problems updated.  No Known Allergies  Past Medical History:  Diagnosis Date   Allergy    Gestational hypertension 05/11/2021   [facility] Guidelines for Antenatal Testing and Sonography                       Updated  11/21/2020 with Dr. Tama High  INDICATION Growth U/S BPP weekly DELIVERY RECOMMENDATION (GA) Diabetes   A1 - good control     A2 - good control     A2  - poor control or poor compliance    (Macrosomia or polyhydramnios)     A2/B and B-C Pregestational Type II DM    Poor control B-C or D-R-F-T  or  Type I DM      Pregnancy induced hypertension     Current Outpatient Medications on File Prior to Visit  Medication Sig Dispense Refill   Blood Pressure Monitoring (BLOOD PRESSURE MONITOR AUTOMAT) DEVI 1 Device by Does not apply route daily. Automatic blood pressure cuff regular size. To monitor blood pressure regularly at home. ICD-10 code: O09.90 1 each 0   NIFEdipine (PROCARDIA-XL/NIFEDICAL-XL) 30 MG 24 hr tablet Take 1 tablet (30 mg total) by mouth daily. Can increase to twice a day as needed for symptomatic contractions 90 tablet 1   No current facility-administered medications on file prior to visit.    Objective:   Vitals:   09/27/22 1345  BP: 121/78  Pulse: 99   Resp: 16  SpO2: 100%  Weight: 178 lb 12.8 oz (81.1 kg)  Height: 5\' 3"  (1.6 m)    Exam General appearance : Awake, alert, not in any distress. Speech Clear. Not toxic looking HEENT: Atraumatic and Normocephalic, pupils equally reactive to light and accomodation Neck: Supple, no JVD. No cervical lymphadenopathy.  Chest: Good air entry bilaterally, no added sounds  CVS: S1 S2 regular, no murmurs.  Abdomen: Bowel sounds present, Non tender and not distended with no gaurding, rigidity or rebound. Extremities: B/L Lower Ext shows no edema, both legs are warm to touch Neurology: Awake alert, and oriented X 3, CN II-XII intact, Non focal Skin: No Rash  Data Review Lab Results  Component Value Date   HGBA1C 5.9 (A) 10/02/2021   HGBA1C 5.6 11/29/2020   HGBA1C 5.4 10/08/2013    Assessment & Plan  [place] was seen today for blood pressure check and form completion.  Diagnoses and all orders for this visit:  Hypertension, unspecified type Blood pressure is well-controlled on Procardia 30 mg ER taking daily and safe to use with lactating.  She continues to monitor her sodium intake and eliminate processed foods. CMP  Seborrheic dermatitis of scalp 2/2  Rash and other nonspecific skin eruption Unclear if this is hormonal but will refer to dermatology.       Ambulatory referral to Dermatology   Diet controlled gestational diabetes mellitus (GDM) in third trimester  Ck A1C   Patient have been counseled extensively about nutrition and exercise. Other issues discussed during this visit include: low cholesterol diet, weight control and daily exercise, foot care, annual eye examinations at Ophthalmology, importance of adherence with medications and regular follow-up. We also discussed long term complications of uncontrolled diabetes and hypertension.   Return in about 6 months (around 03/28/2023) for HTN.  The patient was given clear instructions to go to ER or return to medical center  if symptoms don't improve, worsen or new problems develop. The patient verbalized understanding. The patient was told to call to get lab results if they haven't heard anything in the next week.   This note has been created with Surveyor, quantity. Any transcriptional errors are unintentional.   Kerin Perna, NP 09/27/2022, 2:07 PM

## 2022-09-28 LAB — CMP14+EGFR
ALT: 12 IU/L (ref 0–32)
AST: 10 IU/L (ref 0–40)
Albumin/Globulin Ratio: 1.6 (ref 1.2–2.2)
Albumin: 4.9 g/dL (ref 4.0–5.0)
Alkaline Phosphatase: 96 IU/L (ref 44–121)
BUN/Creatinine Ratio: 16 (ref 9–23)
BUN: 15 mg/dL (ref 6–20)
Bilirubin Total: 0.3 mg/dL (ref 0.0–1.2)
CO2: 24 mmol/L (ref 20–29)
Calcium: 9.4 mg/dL (ref 8.7–10.2)
Chloride: 99 mmol/L (ref 96–106)
Creatinine, Ser: 0.95 mg/dL (ref 0.57–1.00)
Globulin, Total: 3 g/dL (ref 1.5–4.5)
Glucose: 122 mg/dL — ABNORMAL HIGH (ref 70–99)
Potassium: 3.8 mmol/L (ref 3.5–5.2)
Sodium: 138 mmol/L (ref 134–144)
Total Protein: 7.9 g/dL (ref 6.0–8.5)
eGFR: 83 mL/min/{1.73_m2} (ref >59)

## 2022-09-28 LAB — CBC WITH DIFFERENTIAL/PLATELET
Basophils Absolute: 0 10*3/uL (ref 0.0–0.2)
Basos: 1 %
EOS (ABSOLUTE): 0.2 10*3/uL (ref 0.0–0.4)
Eos: 3 %
Hematocrit: 40.6 % (ref 34.0–46.6)
Hemoglobin: 13.6 g/dL (ref 11.1–15.9)
Immature Grans (Abs): 0 10*3/uL (ref 0.0–0.1)
Immature Granulocytes: 0 %
Lymphocytes Absolute: 2 10*3/uL (ref 0.7–3.1)
Lymphs: 34 %
MCH: 30.9 pg (ref 26.6–33.0)
MCHC: 33.5 g/dL (ref 31.5–35.7)
MCV: 92 fL (ref 79–97)
Monocytes Absolute: 0.3 10*3/uL (ref 0.1–0.9)
Monocytes: 5 %
Neutrophils Absolute: 3.3 10*3/uL (ref 1.4–7.0)
Neutrophils: 57 %
Platelets: 263 10*3/uL (ref 150–450)
RBC: 4.4 x10E6/uL (ref 3.77–5.28)
RDW: 12.4 % (ref 11.7–15.4)
WBC: 5.7 10*3/uL (ref 3.4–10.8)

## 2022-09-28 LAB — HEMOGLOBIN A1C
Est. average glucose Bld gHb Est-mCnc: 123 mg/dL
Hgb A1c MFr Bld: 5.9 % — ABNORMAL HIGH (ref 4.8–5.6)

## 2022-11-14 ENCOUNTER — Encounter (INDEPENDENT_AMBULATORY_CARE_PROVIDER_SITE_OTHER): Payer: Self-pay | Admitting: Primary Care

## 2023-04-04 ENCOUNTER — Ambulatory Visit (INDEPENDENT_AMBULATORY_CARE_PROVIDER_SITE_OTHER): Payer: Medicaid Other | Admitting: Primary Care

## 2023-04-04 ENCOUNTER — Encounter (INDEPENDENT_AMBULATORY_CARE_PROVIDER_SITE_OTHER): Payer: Self-pay | Admitting: Primary Care

## 2023-04-04 ENCOUNTER — Other Ambulatory Visit (HOSPITAL_COMMUNITY)
Admission: RE | Admit: 2023-04-04 | Discharge: 2023-04-04 | Disposition: A | Discharge status: Home / Self Care | Payer: Medicaid Other | Source: Ambulatory Visit | Attending: Primary Care | Admitting: Primary Care

## 2023-04-04 VITALS — BP 130/84 | HR 87 | Resp 16 | Wt 181.6 lb

## 2023-04-04 DIAGNOSIS — I10 Essential (primary) hypertension: Secondary | ICD-10-CM | POA: Diagnosis not present

## 2023-04-04 DIAGNOSIS — N898 Other specified noninflammatory disorders of vagina: Secondary | ICD-10-CM | POA: Insufficient documentation

## 2023-04-04 DIAGNOSIS — R300 Dysuria: Secondary | ICD-10-CM

## 2023-04-04 LAB — POCT URINALYSIS DIP (CLINITEK)
Bilirubin, UA: NEGATIVE
Glucose, UA: NEGATIVE mg/dL
Ketones, POC UA: NEGATIVE mg/dL
Nitrite, UA: NEGATIVE
POC PROTEIN,UA: NEGATIVE
Spec Grav, UA: 1.01 (ref 1.010–1.025)
Urobilinogen, UA: 0.2 E.U./dL
pH, UA: 6 (ref 5.0–8.0)

## 2023-04-04 MED ORDER — NIFEDIPINE ER 30 MG PO TB24: 30.0000 mg | ORAL_TABLET | Freq: Every day | ORAL | 1 refills | Status: DC

## 2023-04-04 NOTE — Progress Notes (Signed)
[facility]   Subjective:   Danielle Baldwin is a 32 y.o. G90P1001 female complains of an abnormal vaginal discharge for 7 days. Discharge described as: white, thick, and odorless. Vaginal symptoms include vulvar itching.Vulvar symptoms include vulvar itching.STI Risk: Very low risk of STD exposure.   Other associated symptoms: bumps, burning, and vulvar itching.Menstrual pattern: She had been bleeding regularly. Contraception: none.  She denies vomiting, abdominal pain, fussiness, and diarrhea recent antibiotic exposure, reports  changes ipads- she was at work when cycle came on problem every since.  Denies  changes  detergents coinciding with the onset of her symptoms.  She has not previously self treated or been under treatment by another provider for these symptoms.     Gynecologic History No LMP recorded. Contraception: condoms  Obstetric History OB History  Gravida Para Term Preterm AB Living  1 1 1     1   SAB IAB Ectopic Multiple Live Births        0 1    # Outcome Date GA Lbr Len/2nd Weight Sex Type Anes PTL Lv  1 Term 06/09/21 [redacted]w[redacted]d 31:18 / 01:07 7 lb 6.3 oz (3.354 kg) M Vag-Spont None  LIV    Past Medical History:  Diagnosis Date   Allergy    Gestational hypertension 05/11/2021   [facility] Guidelines for Antenatal Testing and Sonography                       Updated  11/21/2020 with Dr. 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  Current Outpatient Medications on File Prior to Visit  Medication Sig Dispense Refill   Blood Pressure Monitoring (BLOOD PRESSURE MONITOR AUTOMAT) DEVI 1 Device by Does not apply route daily. Automatic blood pressure cuff  regular size. To monitor blood pressure regularly at home. ICD-10 code: O09.90 1 each 0   NIFEdipine (PROCARDIA-XL/NIFEDICAL-XL) 30 MG 24 hr tablet Take 1 tablet (30 mg total) by mouth daily. Can increase to twice a day as needed for symptomatic contractions 90 tablet 1   No current facility-administered medications on file prior to visit.    No Known Allergies  Social History   Socioeconomic History   Marital status: Single    Spouse name: Not on file   Number of children: Not on file   Years of education: Not on file   Highest education level: Some college, no degree  Occupational History   Not on file  Tobacco Use   Smoking status: Former    Current packs/day: 0.00    Types: Cigarettes    Quit date: 08/04/2015    Years since quitting: 7.6   Smokeless tobacco: Never  Vaping Use   Vaping status: Never Used  Substance and Sexual Activity   Alcohol use: No    Alcohol/week: 0.0 standard drinks of alcohol   Drug use: No   Sexual activity: Yes    Birth control/protection: None  Other Topics Concern   Not on file  Social History Narrative   Not on  file   Social Determinants of Health   Financial Resource Strain: Not on file  Food Insecurity: Not on file  Transportation Needs: Not on file  Physical Activity: Not on file  Stress: Not on file  Social Connections: Not on file  Intimate Partner Violence: Not on file    Family History  Problem Relation Age of Onset   Hypertension Mother    Hypertension Maternal Grandmother    Cancer Maternal Grandmother    Cancer Maternal Grandfather     The following portions of the patient's history were reviewed and updated as appropriate: allergies, current medications, past family history, past medical history, past social history, past surgical history and problem list.  Review of Systems Pertinent items noted in HPI and remainder of comprehensive ROS otherwise negative.   Objective:  Blood Pressure 130/84 (BP Location: Right  Arm, Patient Position: Sitting, Cuff Size: Large)   Pulse 87   Respiration 16   Weight 181 lb 9.6 oz (82.4 kg)   Oxygen Saturation 98%   Body Mass Index 32.17 kg/m  CONSTITUTIONAL: Well-developed, well-nourished female in no acute distress.  HENT:  Normocephalic, atraumatic, External right and left ear normal. Oropharynx is clear and moist EYES: Conjunctivae and EOM are normal. Pupils are equal, round, and reactive to light. No scleral icterus.  NECK: Normal range of motion, supple, no masses.  Normal thyroid.  SKIN: Skin is warm and dry. No rash noted. Not diaphoretic. No erythema. No pallor. NEUROLGIC: Alert and oriented to person, place, and time. Normal reflexes, muscle tone coordination. No cranial nerve deficit noted. PSYCHIATRIC: Normal mood and affect. Normal behavior. Normal judgment and thought content. CARDIOVASCULAR: Normal heart rate noted, regular rhythm RESPIRATORY: Clear to auscultation bilaterally. Effort and breath sounds normal, no problems with respiration noted. ABDOMEN: Soft, normal bowel sounds, no distention noted.  No tenderness, rebound or guarding.  MUSCULOSKELETAL: Normal range of motion. No tenderness.  No cyanosis, clubbing, or edema.  2+ distal pulses.   Assessment:  Danielle Baldwin was seen today for hypertension.  Diagnoses and all orders for this visit:  Vaginal discharge -     Cancel: Cervicovaginal ancillary only -     Cervicovaginal ancillary only  Essential hypertension    NIFEdipine (ADALAT CC) 30 MG 24 hr tablet; Take 1 tablet (30 mg total) by mouth daily Diastolic < 80 - 75 preferred    Dysuria  U/A/PCOT  Reviewed expectations re: course of current medical issues. Questions answered. Outlined signs and symptoms indicating need for more acute intervention. Patient verbalized understanding. After Visit Summary given.   Will follow up on all labs ordered today.  Patient counseled regarding condom use with each sexual activity to promote wellness  and prevention of transmission of HIV, syphilis, herpes simplex virus, gonorrhea, chlamydia and trichomoniasis.   This note has been created with Education officer, environmental. Any transcriptional errors are unintentional.   Grayce Sessions, NP 04/04/2023, 3:24 PM

## 2023-04-07 ENCOUNTER — Encounter (INDEPENDENT_AMBULATORY_CARE_PROVIDER_SITE_OTHER): Payer: Self-pay | Admitting: Primary Care

## 2023-04-08 ENCOUNTER — Other Ambulatory Visit (INDEPENDENT_AMBULATORY_CARE_PROVIDER_SITE_OTHER): Payer: Self-pay | Admitting: Primary Care

## 2023-04-08 DIAGNOSIS — A599 Trichomoniasis, unspecified: Secondary | ICD-10-CM

## 2023-04-08 DIAGNOSIS — B9689 Other specified bacterial agents as the cause of diseases classified elsewhere: Secondary | ICD-10-CM

## 2023-04-08 MED ORDER — METRONIDAZOLE 500 MG PO TABS: 500.0000 mg | ORAL_TABLET | Freq: Two times a day (BID) | ORAL | 0 refills | Status: DC

## 2023-05-21 ENCOUNTER — Other Ambulatory Visit (INDEPENDENT_AMBULATORY_CARE_PROVIDER_SITE_OTHER): Payer: Self-pay

## 2023-05-21 ENCOUNTER — Telehealth (INDEPENDENT_AMBULATORY_CARE_PROVIDER_SITE_OTHER): Payer: Self-pay | Admitting: Primary Care

## 2023-05-21 DIAGNOSIS — I10 Essential (primary) hypertension: Secondary | ICD-10-CM

## 2023-05-21 MED ORDER — NIFEDIPINE ER 30 MG PO TB24: 30.0000 mg | ORAL_TABLET | Freq: Every day | ORAL | 1 refills | Status: DC

## 2023-05-21 NOTE — Telephone Encounter (Signed)
Pt stated PCP was going to send BP medication as discussed at her last appointment; however, pt stated her pharmacy has no medication for pickup for her.  Preferred pharmacy for pick up is below.  [id number] [place], [place] - [address]  [address] [place] [address]  Phone: [phone] Fax: [phone]  Hours: Not open 24 hours    Please advise.

## 2023-05-21 NOTE — Telephone Encounter (Signed)
Rx has been sent  

## 2024-03-24 ENCOUNTER — Emergency Department (HOSPITAL_COMMUNITY)
Admission: EM | Admit: 2024-03-24 | Discharge: 2024-03-25 | Disposition: A | Discharge status: Home / Self Care | Attending: Emergency Medicine | Admitting: Emergency Medicine

## 2024-03-24 DIAGNOSIS — S9782XA Crushing injury of left foot, initial encounter: Secondary | ICD-10-CM | POA: Diagnosis present
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
## 2024-03-25 ENCOUNTER — Emergency Department (HOSPITAL_COMMUNITY)
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
## 2024-03-25 NOTE — Progress Notes (Signed)
 Orthopedic Tech Progress Note Patient Details:  Ruhi Kopke Aug 16, 1991 [id number]  Ortho Devices Type of Ortho Device: Postop shoe/boot Ortho Device/Splint Interventions: Ordered, Application, Adjustment   Post Interventions Patient Tolerated: Well Instructions Provided: Care of device, Adjustment of device  [place] A Syed Zukas 03/25/2024, 2:10 AM

## 2024-07-06 ENCOUNTER — Ambulatory Visit: Admitting: Physician Assistant

## 2024-07-14 ENCOUNTER — Other Ambulatory Visit (HOSPITAL_COMMUNITY)
Admission: RE | Admit: 2024-07-14 | Discharge: 2024-07-14 | Disposition: A | Discharge status: Home / Self Care | Source: Ambulatory Visit | Attending: Primary Care | Admitting: Primary Care

## 2024-07-14 ENCOUNTER — Ambulatory Visit (INDEPENDENT_AMBULATORY_CARE_PROVIDER_SITE_OTHER): Admitting: Primary Care

## 2024-07-14 VITALS — BP 138/92 | HR 80 | Resp 16 | Ht 63.0 in | Wt 174.0 lb

## 2024-07-14 DIAGNOSIS — Z124 Encounter for screening for malignant neoplasm of cervix: Secondary | ICD-10-CM | POA: Insufficient documentation

## 2024-07-14 DIAGNOSIS — Z13220 Encounter for screening for lipoid disorders: Secondary | ICD-10-CM

## 2024-07-14 DIAGNOSIS — R7309 Other abnormal glucose: Secondary | ICD-10-CM

## 2024-07-14 DIAGNOSIS — Z1159 Encounter for screening for other viral diseases: Secondary | ICD-10-CM

## 2024-07-14 DIAGNOSIS — I10 Essential (primary) hypertension: Secondary | ICD-10-CM

## 2024-07-14 MED ORDER — NIFEDIPINE ER OSMOTIC RELEASE 30 MG PO TB24: 30.0000 mg | ORAL_TABLET | Freq: Every day | ORAL | 1 refills | Status: DC

## 2024-07-14 NOTE — Progress Notes (Signed)
 [facility]  WELL-WOMAN PHYSICAL & PAP Patient name: Danielle Baldwin MRN [id number]  Date of birth: 11-Dec-1990 Chief Complaint:   Gynecologic Exam  History of Present Illness:   Danielle Baldwin is a 33 y.o. G43P1001 female being seen today for a routine well-woman exam.   GYN exam   The current method of family planning is none.  No LMP recorded. Last pap  December 08, 2020 Results were:  Last mammogram:NA. Family h/o breast cancer: No  Family h/o colorectal cancer: No  Health Maintenance  Topic Date Due   DTaP/Tdap/Td vaccine (1 - Tdap) Never done   Hepatitis B Vaccine (1 of 3 - 19+ 3-dose series) Never done   HPV Vaccine (1 - 3-dose SCDM series) Never done   Pap with HPV screening  12/09/2023   COVID-19 Vaccine (1 - 2025-26 season) Never done   Flu Shot  12/01/2024*   Hepatitis C Screening  Completed   HIV Screening  Completed   Pneumococcal Vaccine  Aged Out   Meningitis B Vaccine  Aged Out  *Topic was postponed. The date shown is not the original due date.   Review of Systems:    Denies any headaches, blurred vision, fatigue, shortness of breath, chest pain, abdominal pain, abnormal vaginal discharge/itching/odor/irritation, problems with periods, bowel movements, urination, or intercourse unless otherwise stated above.  Pertinent History Reviewed:   Reviewed past medical,surgical, social and family history.  Reviewed problem list, medications and allergies.  History Chinaza has a past medical history of Allergy, Gestational hypertension (05/11/2021), and Pregnancy induced hypertension.   She has no past surgical history on file.   Her family history includes Cancer in her maternal grandfather and maternal grandmother; Hypertension in her maternal grandmother and mother.She reports that she quit smoking about 8 years ago. Her smoking use included cigarettes. She has never used smokeless tobacco. She reports that she does not drink alcohol and does not use  drugs.  Current Outpatient Medications on File Prior to Visit  Medication Sig Dispense Refill   Blood Pressure Monitoring (BLOOD PRESSURE MONITOR AUTOMAT) DEVI 1 Device by Does not apply route daily. Automatic blood pressure cuff regular size. To monitor blood pressure regularly at home. ICD-10 code: O09.90 1 each 0   No current facility-administered medications on file prior to visit.    Physical Assessment:   Vitals:   07/14/24 1512 07/14/24 1524  BP: (!) 146/97 (!) 138/92  Pulse: 80   Resp: 16   SpO2: 100%   Weight: 174 lb (78.9 kg)   Height: 5' 3 (1.6 m)   Body mass index is 30.82 kg/m.        Physical Examination:  General appearance - well appearing, and in no distress Mental status - alert, oriented to person, place, and time Psych:  She has a normal mood and affect Skin - warm and dry, normal color, no suspicious lesions noted Chest - effort normal, all lung fields clear to auscultation bilaterally Heart - normal rate and regular rhythm Neck:  midline trachea, no thyromegaly or nodules Breasts - breasts appear normal, no suspicious masses, no skin or nipple changes or axillary nodes Educated patient on proper self breast examination and had patient to demonstrate SBE. Abdomen - soft, nontender, nondistended, no masses or organomegaly Pelvic-VULVA: normal appearing vulva with no masses, tenderness or lesions   VAGINA: normal appearing vagina with normal color and discharge, no lesions   CERVIX: normal appearing cervix without discharge or lesions, no CMT UTERUS: uterus is felt to  be normal size, shape, consistency and nontender  ADNEXA: No adnexal masses or tenderness noted. Extremities:  No swelling or varicosities noted  No results found for this or any previous visit (from the past 24 hours).   Assessment & Plan:  Eduardo was seen today for gynecologic exam.  Diagnoses and all orders for this visit:  Cervical cancer screening -     Cervicovaginal ancillary  only -     Cytology - PAP  Essential hypertension BP goal - < 130/80 Explained that having normal blood pressure is the goal and medications are helping to get to goal and maintain normal blood pressure. DIET: Limit salt intake, read nutrition labels to check salt content, limit fried and high fatty foods  Avoid using multisymptom OTC cold preparations that generally contain sudafed which can rise BP. Consult with pharmacist on best cold relief products to use for persons with HTN EXERCISE Discussed incorporating exercise such as walking - 30 minutes most days of the week and can do in 10 minute intervals    -     CMP14+EGFR; Future  Lipid screening -     Lipid panel; Future  Elevated glucose level -     CBC with Differential/Platelet; Future -     Hemoglobin A1c; Future  Need for hepatitis B screening test -     Hepatitis B surface antibody,qualitative  Other orders -     NIFEdipine  (PROCARDIA  XL) 30 MG 24 hr tablet; Take 1 tablet (30 mg total) by mouth daily.     Meds: Follow-up: 3 to 6 months  This note has been created with Education officer, environmental. Any transcriptional errors are unintentional.   Rosaline SHAUNNA Bohr, NP 07/19/2024, 2:40 AM

## 2024-07-17 LAB — CERVICOVAGINAL ANCILLARY ONLY
Bacterial Vaginitis (gardnerella): POSITIVE — AB
Candida Glabrata: NEGATIVE
Candida Vaginitis: NEGATIVE
Chlamydia: NEGATIVE
Comment: NEGATIVE
Comment: NEGATIVE
Comment: NEGATIVE
Comment: NEGATIVE
Comment: NEGATIVE
Comment: NORMAL
Neisseria Gonorrhea: NEGATIVE
Trichomonas: NEGATIVE

## 2024-07-20 ENCOUNTER — Telehealth (INDEPENDENT_AMBULATORY_CARE_PROVIDER_SITE_OTHER): Payer: Self-pay | Admitting: Primary Care

## 2024-07-20 ENCOUNTER — Ambulatory Visit (INDEPENDENT_AMBULATORY_CARE_PROVIDER_SITE_OTHER): Payer: Self-pay | Admitting: Primary Care

## 2024-07-20 ENCOUNTER — Other Ambulatory Visit (INDEPENDENT_AMBULATORY_CARE_PROVIDER_SITE_OTHER): Payer: Self-pay | Admitting: Primary Care

## 2024-07-20 DIAGNOSIS — B9689 Other specified bacterial agents as the cause of diseases classified elsewhere: Secondary | ICD-10-CM

## 2024-07-20 MED ORDER — METRONIDAZOLE 500 MG PO TABS: 500.0000 mg | ORAL_TABLET | Freq: Two times a day (BID) | ORAL | 0 refills | Status: DC

## 2024-07-21 ENCOUNTER — Other Ambulatory Visit (INDEPENDENT_AMBULATORY_CARE_PROVIDER_SITE_OTHER): Payer: Self-pay | Admitting: Primary Care

## 2024-07-21 LAB — CYTOLOGY - PAP
Comment: NEGATIVE
Comment: NEGATIVE
Comment: NEGATIVE
Diagnosis: NEGATIVE
HPV 16: NEGATIVE
HPV 18 / 45: NEGATIVE
High risk HPV: POSITIVE — AB

## 2024-07-22 ENCOUNTER — Telehealth: Payer: Self-pay

## 2024-07-22 NOTE — Telephone Encounter (Signed)
 Copied from CRM [id number]. Topic: Clinical - Lab/Test Results >> Jul 22, 2024  9:53 AM Delon DASEN wrote: Reason for CRM: patient calling about results of PAP,  need prescription for diflucan  and also asking about HPV results- [phone]

## 2024-07-27 ENCOUNTER — Other Ambulatory Visit (INDEPENDENT_AMBULATORY_CARE_PROVIDER_SITE_OTHER): Payer: Self-pay | Admitting: Primary Care

## 2024-09-04 ENCOUNTER — Other Ambulatory Visit (INDEPENDENT_AMBULATORY_CARE_PROVIDER_SITE_OTHER): Payer: Self-pay | Admitting: Primary Care

## 2024-09-04 DIAGNOSIS — I10 Essential (primary) hypertension: Secondary | ICD-10-CM

## 2024-09-07 NOTE — Telephone Encounter (Signed)
 Requested medication (s) are due for refill today: Yes  Requested medication (s) are on the active medication list: Yes  Last refill:    Future visit scheduled:   Notes to clinic:  See request.    Requested Prescriptions  Pending Prescriptions Disp Refills   NIFEdipine  (ADALAT  CC) 30 MG 24 hr tablet [Pharmacy Med Name: NIFEDIPINE  ER 30 MG TABLET] 90 tablet 1    Sig: Take 1 tablet (30 mg total) by mouth daily. Can increase to twice a day as needed for symptomatic contractions     Cardiovascular: Calcium Channel Blockers 2 Failed - 09/07/2024 12:33 PM      Failed - Last BP in normal range    BP Readings from Last 1 Encounters:  07/14/24 (!) 138/92         Passed - Last Heart Rate in normal range    Pulse Readings from Last 1 Encounters:  07/14/24 80         Passed - Valid encounter within last 6 months    Recent Outpatient Visits           1 month ago Cervical cancer screening   [place] [facility] Celestia Rosaline SQUIBB, NP   1 year ago Vaginal discharge   [place] [facility] Celestia Rosaline SQUIBB, NP   1 year ago Hypertension, unspecified type   [place] [facility] Celestia Rosaline SQUIBB, NP   2 years ago Vaginal discharge   [place] [facility] Celestia Rosaline SQUIBB, NP   2 years ago Screening for diabetes mellitus   [place] [facility] Celestia Rosaline SQUIBB, NP       Future Appointments             In 5 months Alm Delon SAILOR, DO [facility]

## 2024-09-11 ENCOUNTER — Other Ambulatory Visit (INDEPENDENT_AMBULATORY_CARE_PROVIDER_SITE_OTHER): Payer: Self-pay

## 2024-09-11 ENCOUNTER — Telehealth (INDEPENDENT_AMBULATORY_CARE_PROVIDER_SITE_OTHER): Payer: Self-pay | Admitting: Primary Care

## 2024-09-11 MED ORDER — NIFEDIPINE ER OSMOTIC RELEASE 30 MG PO TB24: 30.0000 mg | ORAL_TABLET | Freq: Every day | ORAL | 1 refills | Status: AC

## 2024-09-11 NOTE — Telephone Encounter (Signed)
 Returned pt call and made aware rx was sent in 07/2024 pt states it was sent to the [facility]... Resent rx in to correct pharmacy which is [facility] [address] also pt will be coming in on Tuesday at 9am for lab work

## 2024-09-11 NOTE — Telephone Encounter (Signed)
 Copied from CRM #[id number]. Topic: Clinical - Prescription Issue >> Sep 11, 2024 11:52 AM Deleta RAMAN wrote: Reason for CRM: patient medication was refused and she's requesting the medication for her blood pressure and vitamin d . Please contact [phone] and when should the patient schedule the next visit for blood panel

## 2024-09-15 ENCOUNTER — Encounter (INDEPENDENT_AMBULATORY_CARE_PROVIDER_SITE_OTHER): Payer: Self-pay | Admitting: Primary Care

## 2024-09-15 ENCOUNTER — Ambulatory Visit (INDEPENDENT_AMBULATORY_CARE_PROVIDER_SITE_OTHER): Payer: Self-pay

## 2024-09-15 ENCOUNTER — Other Ambulatory Visit (HOSPITAL_COMMUNITY)
Admission: RE | Admit: 2024-09-15 | Discharge: 2024-09-15 | Disposition: A | Discharge status: Home / Self Care | Source: Ambulatory Visit | Attending: Primary Care | Admitting: Primary Care

## 2024-09-15 VITALS — BP 118/73 | HR 90 | Resp 16 | Ht 63.0 in | Wt 176.6 lb

## 2024-09-15 DIAGNOSIS — L650 Telogen effluvium: Secondary | ICD-10-CM | POA: Diagnosis not present

## 2024-09-15 DIAGNOSIS — N898 Other specified noninflammatory disorders of vagina: Secondary | ICD-10-CM | POA: Diagnosis not present

## 2024-09-15 DIAGNOSIS — N921 Excessive and frequent menstruation with irregular cycle: Secondary | ICD-10-CM

## 2024-09-15 DIAGNOSIS — R7309 Other abnormal glucose: Secondary | ICD-10-CM

## 2024-09-15 DIAGNOSIS — R7303 Prediabetes: Secondary | ICD-10-CM

## 2024-09-15 DIAGNOSIS — R300 Dysuria: Secondary | ICD-10-CM

## 2024-09-15 DIAGNOSIS — S3141XD Laceration without foreign body of vagina and vulva, subsequent encounter: Secondary | ICD-10-CM

## 2024-09-15 DIAGNOSIS — I10 Essential (primary) hypertension: Secondary | ICD-10-CM

## 2024-09-15 DIAGNOSIS — Z13220 Encounter for screening for lipoid disorders: Secondary | ICD-10-CM

## 2024-09-15 NOTE — Patient Instructions (Signed)
 Telogen Effluvium  Telogen effluvium is a condition in which the body sheds much hair. People describe that they are losing hair "from the roots" in excessive amounts. The hair loss is usually 3 to 6 months following an event. The inciting event may be childbirth, a surgery, an illness with a fever, a traumatic psychological event, weight loss, or the start of a new medication. Some people have no known inciting event. For most people, no treatment is necessary, and the hair will stop falling out and begin to regrow with time. Some women develop a chronic form of telogen effluvium in which they continue to lose hair at an accelerated rate.

## 2024-09-15 NOTE — Progress Notes (Signed)
 " [facility]  Danielle Baldwin, is a 34 y.o. female  RDW:[id number]  FMW:[id number]  DOB - [birth year]  Chief Complaint  Patient presents with   Alopecia    Hair is falling out    Blood Pressure Check       Subjective:   Danielle Baldwin is a 34 y.o. female here today for an acute visit. Bp check- unremarkable 118/73 Patient has No headache, No chest pain, No abdominal pain - No Nausea, No new weakness tingling or numbness, No Cough - shortness of breath/s. She c/o vaginal itching and dysuria. 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 Vaginal tear has been increasing over time. Aggravating factor using b/r and wiping, underwear rubbing and sitting. After childbirth tear was small and not noticeable.  No problems updated.  Comprehensive ROS Pertinent positive and negative noted in HPI   Allergies[1]  Past Medical History:  Diagnosis Date   Allergy    Gestational hypertension 05/11/2021   [facility] Guidelines for Antenatal Testing and Sonography                       Updated  11/21/2020 with Dr. 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 The date shown is not the original due date.     Objective:   Vitals:   09/15/24 1004  BP: 118/73  Pulse: 90  Resp: 16  SpO2: 99%  Weight: 176 lb 9.6 oz (80.1 kg)  Height: 5' 3 (1.6 m)   BP Readings from Last 3 Encounters:  09/15/24 118/73  07/14/24 (!) 138/92  03/25/24 (!) 149/114      Physical Exam Vitals reviewed.  Constitutional:      Appearance: Normal appearance. She is obese.  HENT:     Head: Normocephalic.     Right Ear: Tympanic membrane, ear canal and external ear normal.     Left Ear: Tympanic membrane, ear canal and external ear normal.     Nose: Nose normal.     Mouth/Throat:     Mouth: Mucous membranes are moist.  Eyes:     Extraocular Movements: Extraocular movements intact.     Pupils: Pupils are equal, round, and reactive to light.  Cardiovascular:     Rate and Rhythm: Normal rate.  Pulmonary:     Effort: Pulmonary effort is normal.     Breath sounds: Normal breath sounds.  Abdominal:     General: Bowel sounds are normal.     Palpations: Abdomen is  soft.  Musculoskeletal:        General: Normal range of motion.     Cervical back: Normal range of motion and neck supple.  Skin:    General: Skin is warm and dry.  Neurological:     Mental Status: She is alert and oriented to person, place, and time.  Psychiatric:        Mood and Affect: Mood normal.        Behavior: Behavior normal.        Thought Content: Thought content normal.     Assessment & Plan   Romona was seen today for alopecia and blood pressure check.  Diagnoses and all orders for this visit:  Vaginal odor -     Cervicovaginal ancillary only  Lipid screening -     Lipid panel  Elevated glucose level -     Hemoglobin A1c -     CBC with Differential/Platelet  Essential hypertension Well controlled DIET: Limit salt intake, read nutrition labels to check salt content, limit fried and high fatty foods  Avoid using multisymptom OTC cold preparations that generally contain sudafed which can rise BP. Consult with pharmacist  on best cold relief products to use for persons with HTN EXERCISE Discussed incorporating exercise such as walking - 30 minutes most days of the week and can do in 10 minute intervals    -     CMP14+EGFR  Menorrhagia with irregular cycle -     CBC with Differential  Telogen effluvium -     Vitamin D , 25-hydroxy -     Magnesium, RBC -     CBC with Differential  Prediabetes - educated on lifestyle modifications, including but not limited to diet choices and adding exercise to daily routine.    Laceration of vagina due to childbirth , subsequent encounter -     Ambulatory referral to Gynecology     Patient have been counseled extensively about nutrition and exercise. Other issues discussed during this visit include: low cholesterol diet, weight control and daily exercise, foot care, annual eye examinations at Ophthalmology, importance of adherence with medications and regular follow-up. We also discussed long term complications of uncontrolled diabetes and hypertension.   Return in about 3 months (around 12/14/2024).  The patient was given clear instructions to go to ER or return to medical center if symptoms don't improve, worsen or new problems develop. The patient verbalized understanding. The patient was told to call to get lab results if they haven't heard anything in the next week.   This note has been created with Education officer, environmental. Any transcriptional errors are unintentional.   Danielle SHAUNNA Bohr, NP 09/15/2024, 10:55 AM    [1] No Known Allergies [2]  Current Outpatient Medications on File Prior to Visit  Medication Sig Dispense Refill   Blood Pressure Monitoring (BLOOD PRESSURE MONITOR AUTOMAT) DEVI 1 Device by Does not apply route daily. Automatic blood pressure cuff regular size. To monitor blood pressure regularly at home. ICD-10 code: O09.90 1 each 0   NIFEdipine  (PROCARDIA  XL) 30 MG 24 hr tablet Take 1 tablet (30 mg total)  by mouth daily. 90 tablet 1   No current facility-administered medications on file prior to visit.   "

## 2024-09-15 NOTE — Addendum Note (Signed)
 Addended by: CASIMIR JUVENAL SAUNDERS on: 09/15/2024 11:39 AM   Modules accepted: Orders

## 2024-09-15 NOTE — Addendum Note (Signed)
 Addended by: CASIMIR JUVENAL SAUNDERS on: 09/15/2024 11:38 AM   Modules accepted: Orders

## 2024-09-16 LAB — CBC WITH DIFFERENTIAL/PLATELET
Basophils Absolute: 0 x10E3/uL (ref 0.0–0.2)
Basos: 1 %
EOS (ABSOLUTE): 0.1 x10E3/uL (ref 0.0–0.4)
Eos: 3 %
Hematocrit: 38.6 % (ref 34.0–46.6)
Hemoglobin: 12.7 g/dL (ref 11.1–15.9)
Immature Grans (Abs): 0 x10E3/uL (ref 0.0–0.1)
Immature Granulocytes: 0 %
Lymphocytes Absolute: 1.8 x10E3/uL (ref 0.7–3.1)
Lymphs: 38 %
MCH: 30.9 pg (ref 26.6–33.0)
MCHC: 32.9 g/dL (ref 31.5–35.7)
MCV: 94 fL (ref 79–97)
Monocytes Absolute: 0.3 x10E3/uL (ref 0.1–0.9)
Monocytes: 7 %
Neutrophils Absolute: 2.5 x10E3/uL (ref 1.4–7.0)
Neutrophils: 51 %
Platelets: 266 x10E3/uL (ref 150–450)
RBC: 4.11 x10E6/uL (ref 3.77–5.28)
RDW: 12.1 % (ref 11.7–15.4)
WBC: 4.8 x10E3/uL (ref 3.4–10.8)

## 2024-09-16 LAB — CMP14+EGFR
ALT: 14 IU/L (ref 0–32)
AST: 14 IU/L (ref 0–40)
Albumin: 4.7 g/dL (ref 3.9–4.9)
Alkaline Phosphatase: 92 IU/L (ref 41–116)
BUN/Creatinine Ratio: 12 (ref 9–23)
BUN: 9 mg/dL (ref 6–20)
Bilirubin Total: <0.2 mg/dL (ref 0.0–1.2)
CO2: 21 mmol/L (ref 20–29)
Calcium: 9.1 mg/dL (ref 8.7–10.2)
Chloride: 103 mmol/L (ref 96–106)
Creatinine, Ser: 0.78 mg/dL (ref 0.57–1.00)
Globulin, Total: 2.7 g/dL (ref 1.5–4.5)
Glucose: 110 mg/dL — ABNORMAL HIGH (ref 70–99)
Potassium: 4.2 mmol/L (ref 3.5–5.2)
Sodium: 139 mmol/L (ref 134–144)
Total Protein: 7.4 g/dL (ref 6.0–8.5)
eGFR: 104 mL/min/1.73

## 2024-09-16 LAB — LIPID PANEL
Chol/HDL Ratio: 2.5 ratio (ref 0.0–4.4)
Cholesterol, Total: 136 mg/dL (ref 100–199)
HDL: 55 mg/dL
LDL Chol Calc (NIH): 68 mg/dL (ref 0–99)
Triglycerides: 61 mg/dL (ref 0–149)
VLDL Cholesterol Cal: 13 mg/dL (ref 5–40)

## 2024-09-16 LAB — CERVICOVAGINAL ANCILLARY ONLY
Bacterial Vaginitis (gardnerella): POSITIVE — AB
Candida Glabrata: NEGATIVE
Candida Vaginitis: NEGATIVE
Chlamydia: NEGATIVE
Comment: NEGATIVE
Comment: NEGATIVE
Comment: NEGATIVE
Comment: NEGATIVE
Comment: NEGATIVE
Comment: NORMAL
Neisseria Gonorrhea: NEGATIVE
Trichomonas: NEGATIVE

## 2024-09-16 LAB — MICROSCOPIC EXAMINATION: Casts: NONE SEEN /LPF

## 2024-09-16 LAB — URINALYSIS, COMPLETE
Bilirubin, UA: NEGATIVE
Glucose, UA: NEGATIVE
Ketones, UA: NEGATIVE
Nitrite, UA: NEGATIVE
Protein,UA: NEGATIVE
RBC, UA: NEGATIVE
Specific Gravity, UA: 1.019 (ref 1.005–1.030)
Urobilinogen, Ur: 0.2 mg/dL (ref 0.2–1.0)
pH, UA: 7 (ref 5.0–7.5)

## 2024-09-16 LAB — HEMOGLOBIN A1C
Est. average glucose Bld gHb Est-mCnc: 120 mg/dL
Hgb A1c MFr Bld: 5.8 % — ABNORMAL HIGH (ref 4.8–5.6)

## 2024-09-17 LAB — SPECIMEN STATUS REPORT

## 2024-09-17 LAB — CBC WITH DIFFERENTIAL/PLATELET

## 2024-09-18 LAB — URINE CULTURE

## 2024-09-19 LAB — CBC WITH DIFFERENTIAL/PLATELET

## 2024-09-19 LAB — MAGNESIUM: Magnesium: 2 mg/dL (ref 1.6–2.3)

## 2024-09-19 LAB — VITAMIN D 25 HYDROXY (VIT D DEFICIENCY, FRACTURES): Vit D, 25-Hydroxy: 28.9 ng/mL — ABNORMAL LOW (ref 30.0–100.0)

## 2024-09-27 ENCOUNTER — Other Ambulatory Visit (INDEPENDENT_AMBULATORY_CARE_PROVIDER_SITE_OTHER): Payer: Self-pay | Admitting: Primary Care

## 2024-09-27 DIAGNOSIS — B9689 Other specified bacterial agents as the cause of diseases classified elsewhere: Secondary | ICD-10-CM

## 2024-09-27 MED ORDER — METRONIDAZOLE 500 MG PO TABS: 500.0000 mg | ORAL_TABLET | Freq: Two times a day (BID) | ORAL | 0 refills | Status: AC

## 2025-02-09 ENCOUNTER — Ambulatory Visit: Admitting: Physician Assistant

## 2025-02-25 ENCOUNTER — Ambulatory Visit: Admitting: Dermatology
